# Patient Record
Sex: Female | Born: 1940 | Race: White | Hispanic: No | Smoking: Former smoker
Health system: Southern US, Community
[De-identification: ages and names within clinical notes are randomized; demographics above are authoritative.]

## PROBLEM LIST (undated history)

## (undated) DIAGNOSIS — H3530 Unspecified macular degeneration: Secondary | ICD-10-CM

## (undated) DIAGNOSIS — I10 Essential (primary) hypertension: Secondary | ICD-10-CM

## (undated) DIAGNOSIS — J449 Chronic obstructive pulmonary disease, unspecified: Secondary | ICD-10-CM

## (undated) HISTORY — PX: JOINT REPLACEMENT: SHX530

---

## 2018-03-21 ENCOUNTER — Emergency Department: Payer: Medicare Other

## 2018-03-21 ENCOUNTER — Other Ambulatory Visit: Payer: Self-pay

## 2018-03-21 ENCOUNTER — Encounter: Payer: Self-pay | Admitting: Emergency Medicine

## 2018-03-21 ENCOUNTER — Inpatient Hospital Stay
Admission: EM | Admit: 2018-03-21 | Discharge: 2018-03-22 | DRG: 191 | Payer: Medicare Other | Attending: Family Medicine | Admitting: Family Medicine

## 2018-03-21 DIAGNOSIS — Z66 Do not resuscitate: Secondary | ICD-10-CM | POA: Diagnosis present

## 2018-03-21 DIAGNOSIS — M549 Dorsalgia, unspecified: Secondary | ICD-10-CM | POA: Diagnosis not present

## 2018-03-21 DIAGNOSIS — Z8679 Personal history of other diseases of the circulatory system: Secondary | ICD-10-CM | POA: Diagnosis not present

## 2018-03-21 DIAGNOSIS — E440 Moderate protein-calorie malnutrition: Secondary | ICD-10-CM | POA: Diagnosis present

## 2018-03-21 DIAGNOSIS — I10 Essential (primary) hypertension: Secondary | ICD-10-CM | POA: Diagnosis not present

## 2018-03-21 DIAGNOSIS — Z87891 Personal history of nicotine dependence: Secondary | ICD-10-CM

## 2018-03-21 DIAGNOSIS — J209 Acute bronchitis, unspecified: Secondary | ICD-10-CM

## 2018-03-21 DIAGNOSIS — Z87820 Personal history of traumatic brain injury: Secondary | ICD-10-CM | POA: Diagnosis not present

## 2018-03-21 DIAGNOSIS — R0902 Hypoxemia: Secondary | ICD-10-CM | POA: Diagnosis present

## 2018-03-21 DIAGNOSIS — E279 Disorder of adrenal gland, unspecified: Secondary | ICD-10-CM | POA: Diagnosis not present

## 2018-03-21 DIAGNOSIS — H119 Unspecified disorder of conjunctiva: Secondary | ICD-10-CM | POA: Diagnosis present

## 2018-03-21 DIAGNOSIS — H3530 Unspecified macular degeneration: Secondary | ICD-10-CM | POA: Diagnosis not present

## 2018-03-21 DIAGNOSIS — Z79899 Other long term (current) drug therapy: Secondary | ICD-10-CM

## 2018-03-21 DIAGNOSIS — G8929 Other chronic pain: Secondary | ICD-10-CM | POA: Diagnosis present

## 2018-03-21 DIAGNOSIS — K449 Diaphragmatic hernia without obstruction or gangrene: Secondary | ICD-10-CM | POA: Diagnosis present

## 2018-03-21 DIAGNOSIS — Z7951 Long term (current) use of inhaled steroids: Secondary | ICD-10-CM

## 2018-03-21 DIAGNOSIS — J440 Chronic obstructive pulmonary disease with acute lower respiratory infection: Secondary | ICD-10-CM | POA: Diagnosis present

## 2018-03-21 DIAGNOSIS — K219 Gastro-esophageal reflux disease without esophagitis: Secondary | ICD-10-CM | POA: Diagnosis present

## 2018-03-21 DIAGNOSIS — R791 Abnormal coagulation profile: Secondary | ICD-10-CM | POA: Diagnosis present

## 2018-03-21 DIAGNOSIS — J449 Chronic obstructive pulmonary disease, unspecified: Secondary | ICD-10-CM | POA: Diagnosis present

## 2018-03-21 DIAGNOSIS — J441 Chronic obstructive pulmonary disease with (acute) exacerbation: Principal | ICD-10-CM

## 2018-03-21 DIAGNOSIS — Z6823 Body mass index (BMI) 23.0-23.9, adult: Secondary | ICD-10-CM

## 2018-03-21 HISTORY — DX: Essential (primary) hypertension: I10

## 2018-03-21 LAB — BLOOD GAS, ARTERIAL
ACID-BASE EXCESS: 7.4 mmol/L — AB (ref 0.0–2.0)
BICARBONATE: 32 mmol/L — AB (ref 20.0–28.0)
FIO2: 21
O2 SAT: 90.2 %
PATIENT TEMPERATURE: 37
PH ART: 7.47 — AB (ref 7.350–7.450)
pCO2 arterial: 44 mmHg (ref 32.0–48.0)
pO2, Arterial: 55 mmHg — ABNORMAL LOW (ref 83.0–108.0)

## 2018-03-21 LAB — INFLUENZA PANEL BY PCR (TYPE A & B)
INFLBPCR: NEGATIVE
Influenza A By PCR: NEGATIVE

## 2018-03-21 LAB — MRSA PCR SCREENING: MRSA BY PCR: NEGATIVE

## 2018-03-21 LAB — BASIC METABOLIC PANEL
ANION GAP: 11 (ref 5–15)
BUN: 12 mg/dL (ref 6–20)
CALCIUM: 9.9 mg/dL (ref 8.9–10.3)
CHLORIDE: 102 mmol/L (ref 101–111)
CO2: 30 mmol/L (ref 22–32)
CREATININE: 0.65 mg/dL (ref 0.44–1.00)
GFR calc non Af Amer: >60 mL/min (ref >60)
GLUCOSE: 103 mg/dL — AB (ref 65–99)
Potassium: 3.9 mmol/L (ref 3.5–5.1)
Sodium: 143 mmol/L (ref 135–145)

## 2018-03-21 LAB — CBC
HEMATOCRIT: 39.3 % (ref 35.0–47.0)
HEMOGLOBIN: 12.8 g/dL (ref 12.0–16.0)
MCH: 28.4 pg (ref 26.0–34.0)
MCHC: 32.6 g/dL (ref 32.0–36.0)
MCV: 87.2 fL (ref 80.0–100.0)
Platelets: 289 10*3/uL (ref 150–440)
RBC: 4.51 MIL/uL (ref 3.80–5.20)
RDW: 14.9 % — AB (ref 11.5–14.5)
WBC: 13.2 10*3/uL — ABNORMAL HIGH (ref 3.6–11.0)

## 2018-03-21 LAB — FIBRIN DERIVATIVES D-DIMER (ARMC ONLY): FIBRIN DERIVATIVES D-DIMER (ARMC): 1269.51 ng{FEU}/mL — AB (ref 0.00–499.00)

## 2018-03-21 LAB — BRAIN NATRIURETIC PEPTIDE: B NATRIURETIC PEPTIDE 5: 71 pg/mL (ref 0.0–100.0)

## 2018-03-21 LAB — TROPONIN I: Troponin I: <0.03 ng/mL (ref <0.03)

## 2018-03-21 MED ORDER — IOPAMIDOL (ISOVUE-370) INJECTION 76%
75.0000 mL | Freq: Once | INTRAVENOUS | Status: AC | PRN
  Administered 2018-03-21: 75 mL via INTRAVENOUS

## 2018-03-21 MED ORDER — OXYCODONE HCL 5 MG PO TABS: 5.0000 mg | ORAL_TABLET | Freq: Four times a day (QID) | ORAL | Status: DC | PRN

## 2018-03-21 MED ORDER — ACETAMINOPHEN 325 MG PO TABS
ORAL_TABLET | ORAL | Status: AC
  Filled 2018-03-21: qty 2

## 2018-03-21 MED ORDER — MOMETASONE FURO-FORMOTEROL FUM 200-5 MCG/ACT IN AERO
2.0000 | INHALATION_SPRAY | Freq: Two times a day (BID) | RESPIRATORY_TRACT | Status: DC
  Administered 2018-03-21 – 2018-03-22 (×2): 2 via RESPIRATORY_TRACT
  Filled 2018-03-21: qty 8.8

## 2018-03-21 MED ORDER — ACETAMINOPHEN 325 MG PO TABS: 650.0000 mg | ORAL_TABLET | Freq: Four times a day (QID) | ORAL | Status: DC | PRN

## 2018-03-21 MED ORDER — METHYLPREDNISOLONE SODIUM SUCC 125 MG IJ SOLR
125.0000 mg | INTRAMUSCULAR | Status: AC
  Administered 2018-03-21: 125 mg via INTRAVENOUS
  Filled 2018-03-21: qty 2

## 2018-03-21 MED ORDER — SENNOSIDES-DOCUSATE SODIUM 8.6-50 MG PO TABS
2.0000 | ORAL_TABLET | Freq: Two times a day (BID) | ORAL | Status: DC
Start: 2018-03-21 — End: 2018-03-22
  Administered 2018-03-21 – 2018-03-22 (×2): 2 via ORAL
  Filled 2018-03-21 (×2): qty 2

## 2018-03-21 MED ORDER — POLYETHYLENE GLYCOL 3350 17 G PO PACK
17.0000 g | PACK | Freq: Every day | ORAL | Status: DC | PRN
Start: 2018-03-21 — End: 2018-03-22

## 2018-03-21 MED ORDER — ALBUTEROL SULFATE (2.5 MG/3ML) 0.083% IN NEBU: 2.5000 mg | INHALATION_SOLUTION | RESPIRATORY_TRACT | Status: DC | PRN

## 2018-03-21 MED ORDER — SERTRALINE HCL 50 MG PO TABS
50.0000 mg | ORAL_TABLET | Freq: Every day | ORAL | Status: DC
  Administered 2018-03-22: 50 mg via ORAL
  Filled 2018-03-21: qty 1

## 2018-03-21 MED ORDER — FAMOTIDINE IN NACL 20-0.9 MG/50ML-% IV SOLN
20.0000 mg | Freq: Two times a day (BID) | INTRAVENOUS | Status: DC
  Administered 2018-03-21 – 2018-03-22 (×2): 20 mg via INTRAVENOUS
  Filled 2018-03-21 (×2): qty 50

## 2018-03-21 MED ORDER — ACETAMINOPHEN 325 MG PO TABS: 975.0000 mg | ORAL_TABLET | Freq: Four times a day (QID) | ORAL | Status: DC | PRN

## 2018-03-21 MED ORDER — GUAIFENESIN ER 600 MG PO TB12
600.0000 mg | ORAL_TABLET | Freq: Two times a day (BID) | ORAL | Status: DC
  Administered 2018-03-21 – 2018-03-22 (×3): 600 mg via ORAL
  Filled 2018-03-21 (×3): qty 1

## 2018-03-21 MED ORDER — MELATONIN 5 MG PO TABS
5.0000 mg | ORAL_TABLET | Freq: Every day | ORAL | Status: DC
  Administered 2018-03-21: 5 mg via ORAL
  Filled 2018-03-21 (×2): qty 1

## 2018-03-21 MED ORDER — AZITHROMYCIN 500 MG PO TABS: 500.0000 mg | ORAL_TABLET | Freq: Every day | ORAL | Status: DC

## 2018-03-21 MED ORDER — PREGABALIN 75 MG PO CAPS
150.0000 mg | ORAL_CAPSULE | Freq: Two times a day (BID) | ORAL | Status: DC
  Administered 2018-03-21 – 2018-03-22 (×2): 150 mg via ORAL
  Filled 2018-03-21 (×2): qty 2

## 2018-03-21 MED ORDER — METOPROLOL TARTRATE 25 MG PO TABS
25.0000 mg | ORAL_TABLET | Freq: Two times a day (BID) | ORAL | Status: DC
  Administered 2018-03-21 – 2018-03-22 (×2): 25 mg via ORAL
  Filled 2018-03-21 (×3): qty 1

## 2018-03-21 MED ORDER — POLYVINYL ALCOHOL 1.4 % OP SOLN
1.0000 [drp] | Freq: Every day | OPHTHALMIC | Status: DC | PRN
  Filled 2018-03-21: qty 15

## 2018-03-21 MED ORDER — AZITHROMYCIN 500 MG PO TABS: 250.0000 mg | ORAL_TABLET | Freq: Every day | ORAL | Status: DC

## 2018-03-21 MED ORDER — IPRATROPIUM-ALBUTEROL 0.5-2.5 (3) MG/3ML IN SOLN
3.0000 mL | Freq: Once | RESPIRATORY_TRACT | Status: AC
  Administered 2018-03-21: 3 mL via RESPIRATORY_TRACT
  Filled 2018-03-21: qty 3

## 2018-03-21 MED ORDER — METHYLPREDNISOLONE SODIUM SUCC 125 MG IJ SOLR
60.0000 mg | Freq: Four times a day (QID) | INTRAMUSCULAR | Status: DC
  Administered 2018-03-21 – 2018-03-22 (×3): 60 mg via INTRAVENOUS
  Filled 2018-03-21 (×3): qty 2

## 2018-03-21 MED ORDER — ACETAMINOPHEN 325 MG PO TABS
650.0000 mg | ORAL_TABLET | Freq: Once | ORAL | Status: AC
  Administered 2018-03-21: 650 mg via ORAL

## 2018-03-21 MED ORDER — ALBUTEROL SULFATE (2.5 MG/3ML) 0.083% IN NEBU
2.5000 mg | INHALATION_SOLUTION | Freq: Once | RESPIRATORY_TRACT | Status: AC
  Administered 2018-03-21: 2.5 mg via RESPIRATORY_TRACT
  Filled 2018-03-21: qty 3

## 2018-03-21 MED ORDER — LORATADINE 10 MG PO TABS
5.0000 mg | ORAL_TABLET | Freq: Every day | ORAL | Status: DC
  Administered 2018-03-21 – 2018-03-22 (×2): 5 mg via ORAL
  Filled 2018-03-21 (×2): qty 1

## 2018-03-21 MED ORDER — CALCIUM CARBONATE ANTACID 500 MG PO CHEW: 1.0000 | CHEWABLE_TABLET | Freq: Three times a day (TID) | ORAL | Status: DC | PRN

## 2018-03-21 MED ORDER — ONDANSETRON HCL 4 MG/2ML IJ SOLN: 4.0000 mg | Freq: Four times a day (QID) | INTRAMUSCULAR | Status: DC | PRN

## 2018-03-21 MED ORDER — ADULT MULTIVITAMIN W/MINERALS CH
1.0000 | ORAL_TABLET | ORAL | Status: DC
  Administered 2018-03-22: 1 via ORAL
  Filled 2018-03-21: qty 1

## 2018-03-21 MED ORDER — FERROUS SULFATE 325 (65 FE) MG PO TABS
325.0000 mg | ORAL_TABLET | ORAL | Status: DC
  Administered 2018-03-22: 325 mg via ORAL
  Filled 2018-03-21: qty 1

## 2018-03-21 MED ORDER — AZITHROMYCIN 250 MG PO TABS
250.0000 mg | ORAL_TABLET | Freq: Every day | ORAL | Status: DC
  Administered 2018-03-22: 250 mg via ORAL
  Filled 2018-03-21: qty 1

## 2018-03-21 MED ORDER — HYDRALAZINE HCL 20 MG/ML IJ SOLN: 10.0000 mg | INTRAMUSCULAR | Status: DC | PRN

## 2018-03-21 MED ORDER — ACETAMINOPHEN 650 MG RE SUPP: 650.0000 mg | Freq: Four times a day (QID) | RECTAL | Status: DC | PRN

## 2018-03-21 MED ORDER — SODIUM CHLORIDE 0.9% FLUSH
3.0000 mL | Freq: Two times a day (BID) | INTRAVENOUS | Status: DC
  Administered 2018-03-21 – 2018-03-22 (×2): 3 mL via INTRAVENOUS

## 2018-03-21 MED ORDER — ONDANSETRON HCL 4 MG PO TABS: 4.0000 mg | ORAL_TABLET | Freq: Four times a day (QID) | ORAL | Status: DC | PRN

## 2018-03-21 MED ORDER — LACTATED RINGERS IV SOLN
INTRAVENOUS | Status: DC
  Administered 2018-03-21: 18:00:00 via INTRAVENOUS

## 2018-03-21 MED ORDER — LIDOCAINE 5 % EX PTCH
1.0000 | MEDICATED_PATCH | CUTANEOUS | Status: DC
  Administered 2018-03-22: 1 via TRANSDERMAL
  Filled 2018-03-21: qty 1

## 2018-03-21 MED ORDER — AZITHROMYCIN 500 MG PO TABS
500.0000 mg | ORAL_TABLET | Freq: Once | ORAL | Status: AC
  Administered 2018-03-21: 500 mg via ORAL
  Filled 2018-03-21: qty 1

## 2018-03-21 MED ORDER — ENOXAPARIN SODIUM 40 MG/0.4ML ~~LOC~~ SOLN
40.0000 mg | SUBCUTANEOUS | Status: DC
  Administered 2018-03-21: 40 mg via SUBCUTANEOUS
  Filled 2018-03-21: qty 0.4

## 2018-03-21 MED ORDER — QUETIAPINE FUMARATE 25 MG PO TABS
25.0000 mg | ORAL_TABLET | Freq: Every day | ORAL | Status: DC
  Administered 2018-03-21: 25 mg via ORAL
  Filled 2018-03-21: qty 1

## 2018-03-21 NOTE — ED Provider Notes (Signed)
[facility] Emergency Department Provider Note   ____________________________________________   First MD Initiated Contact with Patient 03/21/18 1205     (approximate)  I have reviewed the triage vital signs and the nursing notes.   HISTORY  Chief Complaint Shortness of Breath    HPI Heather [name] is a 77 y.o. female reports a previous history of COPD, previous left hip fracture which she reports was treated surgically at [facility] a long time ago.  Patient presents today reports she is living in assisted living, yesterday she began X feeling slightly short of breath, this morning she was wheezing.  She was given 2 breathing treatments without relief prompting her to call EMS.  She reports her breathing seems slightly improved at present, but still feels slightly short of breath.  Does not use oxygen at home.  Occasional cough, no productive sputum.  No fever.  No abdominal pain.  Denies chest pain.  No leg swelling, denies history of blood clots.  Currently reports mild shortness of breath.  Past Medical History:  Diagnosis Date  . Hypertension     There are no active problems to display for this patient.   History reviewed. No pertinent surgical history.  Prior to Admission medications   Medication Sig Start Date End Date Taking? Authorizing Provider  acetaminophen (TYLENOL) 325 MG tablet Take 975 mg by mouth every 6 (six) hours as needed.   Yes [provider]  albuterol (PROVENTIL HFA;VENTOLIN HFA) 108 (90 Base) MCG/ACT inhaler Inhale 1 puff into the lungs every 6 (six) hours as needed for wheezing or shortness of breath.   Yes [provider]  calcium carbonate (TUMS - DOSED IN MG ELEMENTAL CALCIUM) 500 MG chewable tablet Chew 1 tablet by mouth every 8 (eight) hours as needed for indigestion or heartburn.   Yes [provider]  ferrous sulfate 325 (65 FE) MG EC tablet Take 325 mg by mouth every morning.   Yes  [provider]  Fluticasone-Salmeterol (ADVAIR) 250-50 MCG/DOSE AEPB Inhale 1 puff into the lungs every 12 (twelve) hours.   Yes [provider]  lidocaine (LIDODERM) 5 % Place 1 patch onto the skin daily. Remove & Discard patch within 12 hours or as directed by MD   Yes [provider]  loratadine (CLARITIN) 5 MG chewable tablet Chew 5 mg by mouth daily.   Yes [provider]  Melatonin 3 MG TABS Take 3 mg by mouth at bedtime.   Yes [provider]  Multiple Vitamin (MULTIVITAMIN WITH MINERALS) TABS tablet Take 1 tablet by mouth every morning.   Yes [provider]  omeprazole (PRILOSEC) 40 MG capsule Take 40 mg by mouth every morning.   Yes [provider]  oxyCODONE (OXY IR/ROXICODONE) 5 MG immediate release tablet Take 5 mg by mouth every 6 (six) hours as needed for severe pain.   Yes [provider]  Polyethyl Glycol-Propyl Glycol (SYSTANE) 0.4-0.3 % SOLN Place 1 drop into both eyes daily as needed.   Yes [provider]  polyethylene glycol (MIRALAX / GLYCOLAX) packet Take 17 g by mouth daily as needed.   Yes [provider]  pregabalin (LYRICA) 150 MG capsule Take 150 mg by mouth every 12 (twelve) hours.   Yes [provider]  QUEtiapine (SEROQUEL) 25 MG tablet Take 25 mg by mouth at bedtime.   Yes [provider]  ranitidine (ZANTAC) 75 MG tablet Take 75 mg by mouth every 12 (twelve) hours as needed  for heartburn.   Yes [provider]  sennosides-docusate sodium (SENOKOT-S) 8.6-50 MG tablet Take 2 tablets by mouth 2 (two) times daily.   Yes [provider]  sertraline (ZOLOFT) 50 MG tablet Take 50 mg by mouth daily.   Yes [provider]    Allergies Patient has no known allergies.  No family history on file.  Social History Social History   Tobacco Use  . Smoking status: Former Games developermoker  . Smokeless tobacco: Never Used  Substance Use Topics  .  Alcohol use: Not Currently  . Drug use: Not Currently    Review of Systems Constitutional: No fever/chills Eyes: No visual changes. ENT: No sore throat. Cardiovascular: Denies chest pain. Respiratory: See HPI Gastrointestinal: No abdominal pain.  No nausea, no vomiting.  No diarrhea.  No constipation. Genitourinary: Negative for dysuria. Musculoskeletal: Negative for back pain. Skin: Negative for rash. Neurological: Negative for headaches, focal weakness or numbness.    ____________________________________________   PHYSICAL EXAM:  VITAL SIGNS: ED Triage Vitals  Enc Vitals Group     BP 03/21/18 1145 (!) 160/79     Pulse Rate 03/21/18 1141 92     Resp 03/21/18 1141 20     Temp 03/21/18 1141 97.6 F (36.4 C)     Temp Source 03/21/18 1141 Oral     SpO2 03/21/18 1137 96 %     Weight 03/21/18 1143 144 lb 4.8 oz (65.5 kg)     Height --      Head Circumference --      Peak Flow --      Pain Score 03/21/18 1142 0     Pain Loc --      Pain Edu? --      Excl. in GC? --     Constitutional: Alert and oriented. Well appearing and in no acute distress appears just slightly dyspneic sitting upright. Eyes: Conjunctivae are normal. Head: Atraumatic. Nose: No congestion/rhinnorhea. Mouth/Throat: Mucous membranes are moist. Neck: No stridor.   Cardiovascular: Normal rate, regular rhythm. Grossly normal heart sounds.  Good peripheral circulation. Respiratory: Some mild tachypnea, speaks in phrases.  Mild increased work of breathing.  Lungs CTAB except for slight end expiratory wheezing in the lower lobes bilaterally.  End-tidal CO2 measuring about 30 with a slight pattern of "shark fin" appearance on nasal cannula. Gastrointestinal: Soft and nontender. No distention. Musculoskeletal: No lower extremity tenderness nor edema. Neurologic:  Normal speech and language. No gross focal neurologic deficits are appreciated.  Skin:  Skin is warm, dry and intact. No rash noted. Psychiatric:  Mood and affect are normal. Speech and behavior are normal.  ____________________________________________   LABS (all labs ordered are listed, but only abnormal results are displayed)  Labs Reviewed  CBC - Abnormal; Notable for the following components:      Result Value   WBC 13.2 (*)    RDW 14.9 (*)    All other components within normal limits  BASIC METABOLIC PANEL - Abnormal; Notable for the following components:   Glucose, Bld 103 (*)    All other components within normal limits  FIBRIN DERIVATIVES D-DIMER ([facility] ONLY) - Abnormal; Notable for the following components:   Fibrin derivatives D-dimer ([facility]) 1,269.51 (*)    All other components within normal limits  TROPONIN I  BRAIN NATRIURETIC PEPTIDE  INFLUENZA PANEL BY PCR (TYPE A & B)   ____________________________________________  EKG  Reviewed and her by me 1145 Heart rate 90 QRS 100 QTC 450 Normal sinus rhythm, no  evidence of acute ischemia denoted.  Some slight baseline artifact is present. ____________________________________________  RADIOLOGY  CT imaging reviewed, negative for acute.    Incidental adrenal mass is noted. ____________________________________________   PROCEDURES  Procedure(s) performed: None  Procedures  Critical Care performed: No  ____________________________________________   INITIAL IMPRESSION / ASSESSMENT AND PLAN / ED COURSE  Pertinent labs & imaging results that were available during my care of the patient were reviewed by me and considered in my medical decision making (see chart for details).  Patient returns for evaluation of dyspnea.  Began last night, associated with some wheezing with some improvement with nebulizer treatments given prior to EMS arrival.  She does have some mild ongoing wheezing and does appear slightly dyspneic speaking in phrases.  Denies any chest pain, no evidence of acute cardiac disease.  No associated edema or JVD noted.  No fever, does have a  slightly elevated white count.  History of COPD and bronchitis, today seems to feel similar and clinical exam seem to suggest acute bronchitis.    ----------------------------------------- 3:10 PM on 03/21/2018 -----------------------------------------  Reviewed telemetry record, patient is having oxygen saturations just less than 88% at times.  Family at bedside reports she does still appear slightly dyspneic.  Currently satting in the mid 90s on 2 L nasal cannula speaking short phrases.  Does not appear in any acute in extremis.  Discussed with family at the bedside including patient's son and daughter-in-law, will admit her for COPD exacerbation.  Further workup and care under the hospitalist service.  I have ordered additional nebulizer therapy and azithromycin at this time.  I have also ordered an influenza test given the seasonality, though overall I do not suspect high likelihood of the flu given today's presentation.  ____________________________________________   FINAL CLINICAL IMPRESSION(S) / ED DIAGNOSES  Final diagnoses:  COPD exacerbation (HCC)  Acute bronchitis, unspecified organism      NEW MEDICATIONS STARTED DURING THIS VISIT:  New Prescriptions   No medications on file     Note:  This document was prepared using Dragon voice recognition software and may include unintentional dictation errors.     Sharyn Creamer, MD 03/21/18 1510

## 2018-03-21 NOTE — ED Triage Notes (Signed)
[facility]. EMS reports that pt has hx/o COPD, staff gave pt inhaler this morning but it did not help with her breathing. Pt is in NAD at this time.

## 2018-03-21 NOTE — Progress Notes (Signed)
LCSW met with patient and obtained verbal consent to speak to family and to complete her assessment. Patient was polite and calm.   LCSW offered support to family members and completed assessment and Fl2 started with HCPOA and Guardian her son Lequita Asal.  Patient will return to [facility] once discharged. No further needs  [employer] LCSW [phone]

## 2018-03-21 NOTE — H&P (Addendum)
[employer] [facility] [place] [facility]   PATIENT NAME: Heather [name]    MR#:  [id number]  DATE OF BIRTH:  [birth year]  DATE OF ADMISSION:  03/21/2018  PRIMARY CARE PHYSICIAN: System, Pcp Not In   REQUESTING/REFERRING PHYSICIAN:   CHIEF COMPLAINT:   Chief Complaint  Patient presents with  . Shortness of Breath    HISTORY OF PRESENT ILLNESS: Heather [name]  is a 77 y.o. 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  PAST MEDICAL HISTORY:   Past Medical History:  Diagnosis Date  . Hypertension   See above  PAST SURGICAL HISTORY:  Hysterectomy Hip fracture repair  SOCIAL HISTORY:  Social History   Tobacco Use  . Smoking status: Former Games developer  . Smokeless tobacco: Never Used  Substance Use Topics  . Alcohol use: Not Currently    FAMILY HISTORY:  Hypertension Diabetes Macular degeneration  DRUG ALLERGIES: NKDA  REVIEW OF SYSTEMS:   CONSTITUTIONAL: No fever, +fatigue, weakness.  EYES: No blurred or double vision.  EARS, NOSE, AND THROAT: No tinnitus or ear pain.  RESPIRATORY: + cough, shortness of breath, wheezing, no hemoptysis.  CARDIOVASCULAR: No chest pain, orthopnea, edema.  GASTROINTESTINAL: No nausea, vomiting, diarrhea or abdominal pain.  GENITOURINARY: No dysuria, hematuria.  ENDOCRINE: No  polyuria, nocturia,  HEMATOLOGY: No anemia, easy bruising or bleeding SKIN: No rash or lesion. MUSCULOSKELETAL: No joint pain or arthritis.   NEUROLOGIC: No tingling, numbness, weakness.  PSYCHIATRY: No anxiety or depression.   MEDICATIONS AT HOME:  Prior to Admission medications   Medication Sig Start Date End Date Taking? Authorizing Provider  acetaminophen (TYLENOL) 325 MG tablet Take 975 mg by mouth every 6 (six) hours as needed.   Yes [provider]  albuterol (PROVENTIL HFA;VENTOLIN HFA) 108 (90 Base) MCG/ACT inhaler Inhale 1 puff into the lungs every 6 (six) hours as needed for wheezing or shortness of breath.   Yes [provider]  calcium carbonate (TUMS - DOSED IN MG ELEMENTAL CALCIUM) 500 MG chewable tablet Chew 1 tablet by mouth every 8 (eight) hours as needed for indigestion or heartburn.   Yes [provider]  ferrous sulfate 325 (65 FE) MG EC tablet Take 325 mg by mouth every morning.   Yes [provider]  Fluticasone-Salmeterol (ADVAIR) 250-50 MCG/DOSE AEPB Inhale 1 puff into the lungs every 12 (twelve) hours.   Yes [provider]  lidocaine (LIDODERM) 5 % Place 1 patch onto the skin daily. Remove & Discard patch within 12 hours or as directed by MD   Yes [provider]  loratadine (CLARITIN) 5 MG chewable tablet Chew 5 mg by mouth daily.   Yes [provider]  Melatonin 3 MG TABS Take 3 mg by mouth at bedtime.   Yes [provider]  Multiple Vitamin (MULTIVITAMIN WITH MINERALS) TABS tablet Take 1 tablet by mouth every morning.   Yes [provider]  omeprazole (PRILOSEC) 40 MG capsule Take 40 mg by mouth every morning.   Yes [provider]  oxyCODONE (OXY IR/ROXICODONE) 5 MG immediate release tablet Take 5 mg by mouth every 6 (six) hours as needed for severe pain.   Yes [provider]  Polyethyl Glycol-Propyl Glycol (SYSTANE) 0.4-0.3 % SOLN Place 1 drop into both eyes daily  as needed.   Yes [provider]  polyethylene glycol (MIRALAX / GLYCOLAX) packet Take 17 g by mouth daily as needed.   Yes [provider]  pregabalin (LYRICA) 150 MG capsule Take 150 mg by mouth every 12 (twelve) hours.   Yes [provider]  QUEtiapine (SEROQUEL) 25 MG tablet Take 25 mg by mouth at bedtime.   Yes [provider]  ranitidine (ZANTAC) 75 MG tablet Take 75 mg by mouth every 12 (twelve) hours as needed for heartburn.   Yes [provider]  sennosides-docusate sodium (SENOKOT-S) 8.6-50 MG tablet Take 2 tablets by mouth 2 (two) times daily.   Yes [provider]  sertraline (ZOLOFT) 50 MG tablet Take 50 mg by mouth daily.   Yes [provider]      PHYSICAL EXAMINATION:   VITAL SIGNS: Blood pressure (!) 170/82, pulse 89, temperature 97.6 F (36.4 C), temperature source Oral, resp. rate (!) 22, weight 65.5 kg (144 lb 4.8 oz), SpO2 97 %.  GENERAL:  77 y.o.-year-old patient lying in the bed with no acute distress.  Malnourished/frail appearance EYES: Pupils equal, round, reactive to light and accommodation. No scleral icterus. Extraocular muscles intact.  HEENT: Head atraumatic, normocephalic. Oropharynx and nasopharynx clear.  NECK:  Supple, no jugular venous distention. No thyroid enlargement, no tenderness.  LUNGS: Bilateral diminished breath sounds with crepitus. No use of accessory muscles of respiration.  CARDIOVASCULAR: S1, S2 normal. No murmurs, rubs, or gallops.  ABDOMEN: Soft, nontender, nondistended. Bowel sounds present. No organomegaly or mass.  EXTREMITIES: No pedal edema, cyanosis, or clubbing.  NEUROLOGIC: Cranial nerves II through XII are intact. MAES. Gait not checked.  PSYCHIATRIC: The patient is alert and oriented x 3.  SKIN: No obvious rash, lesion, or ulcer.   LABORATORY PANEL:   CBC Recent Labs  Lab 03/21/18 1146  WBC 13.2*  HGB 12.8  HCT 39.3  PLT 289  MCV 87.2  MCH 28.4  MCHC 32.6   RDW 14.9*   ------------------------------------------------------------------------------------------------------------------  Chemistries  Recent Labs  Lab 03/21/18 1146  NA 143  K 3.9  CL 102  CO2 30  GLUCOSE 103*  BUN 12  CREATININE 0.65  CALCIUM 9.9   ------------------------------------------------------------------------------------------------------------------ CrCl cannot be calculated (Unknown ideal weight.). ------------------------------------------------------------------------------------------------------------------ No results for input(s): TSH, T4TOTAL, T3FREE, THYROIDAB in the last 72 hours.  Invalid input(s): FREET3   Coagulation profile No results for input(s): INR, PROTIME in the last 168 hours. ------------------------------------------------------------------------------------------------------------------- No results for input(s): DDIMER in the last 72 hours. -------------------------------------------------------------------------------------------------------------------  Cardiac Enzymes Recent Labs  Lab 03/21/18 1146  TROPONINI <0.03   ------------------------------------------------------------------------------------------------------------------ Invalid input(s): POCBNP  ---------------------------------------------------------------------------------------------------------------  Urinalysis No results found for: COLORURINE, APPEARANCEUR, LABSPEC, PHURINE, GLUCOSEU, HGBUR, BILIRUBINUR, KETONESUR, PROTEINUR, UROBILINOGEN, NITRITE, LEUKOCYTESUR   RADIOLOGY: Dg Chest 2 View  Result Date: 03/21/2018 CLINICAL DATA:  Respiratory distress.  COPD. EXAM: CHEST - 2 VIEW COMPARISON:  No prior. FINDINGS: Mediastinum and hilar structures normal. Lungs are clear. No pleural effusion or pneumothorax. Heart size normal. Sliding hiatal hernia. No acute bony abnormality. IMPRESSION: 1.  No acute cardiopulmonary disease. 2.  Sliding hiatal hernia.  Electronically Signed  ByMaisie Fus  Register   On: 03/21/2018 13:08   Ct Angio Chest Pe W And/or Wo Contrast  Result Date: 03/21/2018 CLINICAL DATA:  Respiratory distress.  History of COPD. EXAM: CT ANGIOGRAPHY CHEST WITH CONTRAST TECHNIQUE: Multidetector CT imaging of the chest was performed using the standard protocol during bolus administration of intravenous contrast. Multiplanar CT image reconstructions and MIPs were obtained to evaluate the vascular anatomy. CONTRAST:  75mL ISOVUE-370 IOPAMIDOL (ISOVUE-370) INJECTION 76% COMPARISON:  None. FINDINGS: Cardiovascular: There is no pulmonary embolism identified within the main, lobar or segmental pulmonary arteries bilaterally. Heart size is upper normal. No pericardial effusion. Aortic atherosclerosis. No thoracic aortic aneurysm or evidence of aortic dissection. Mediastinum/Nodes: Hiatal hernia, moderate to large. No enlarged lymph nodes seen within the mediastinum or perihilar regions. Esophagus is unremarkable. Trachea and central bronchi are unremarkable. Lungs/Pleura: Mild atelectasis bilaterally. Bilateral upper lobe emphysema, mild to moderate in degree. No evidence of pneumonia. No pleural effusion or pneumothorax. Upper Abdomen: Left adrenal mass measures approximately 3 cm greatest dimension, with Hounsfield unit measurements of 15-23. Musculoskeletal: Thoracic spine kyphosis. No acute or suspicious osseous finding. Review of the MIP images confirms the above findings. IMPRESSION: 1. No acute findings. No pulmonary embolism seen. No pneumonia, pleural effusion or pneumothorax. 2. **An incidental finding of potential clinical significance has been found. Left adrenal mass measuring approximately 3 cm greatest dimension, with indeterminate Hounsfield unit measurements of 15-23. Per consensus guidelines, recommend nonemergent adrenal protocol CT for further characterization.** 3. Hiatal hernia, moderate to large in size. 4. Bilateral upper lobe  emphysematous change, mild to moderate in degree. 5. Aortic atherosclerosis. Aortic Atherosclerosis (ICD10-I70.0) and Emphysema (ICD10-J43.9). Electronically Signed   By: Bary Richard M.D.   On: 03/21/2018 14:11    EKG: Orders placed or performed during the hospital encounter of 03/21/18  . EKG 12-Lead  . EKG 12-Lead  . EKG 12-Lead  . EKG 12-Lead    IMPRESSION AND PLAN: 1 acute on COPD exacerbation Admit to regular nursing for bed, empiric azithromycin for 5-day course, IV Solu-Medrol with tapering as tolerated, supplemental oxygen weaning as tolerated, aggressive pulmonary toilet with bronchodilator therapy, inhaled corticosteroids twice daily, mucolytic agents, respiratory therapy to see, and continue close medical monitoring  2 chronic benign essential hypertension  currently uncontrolled Continue home regiment, add beta-blocker therapy, IV hydralazine as needed systolic blood pressure greater than 160, vitals per routine, make changes as per necessary  3 chronic GERD with large hiatal hernia Stable Pepcid IV twice daily  4 acute on chronic moderate protein energy malnutrition Dietary consulted, check prealbumin level  5 acute elevated d-dimer CT chest noted, negative for PE Check lower extremity Dopplers to evaluate for possible deep venous thrombosis  6 acute left adrenal mass Incidentally found on CT of the chest Will have patient follow-up with surgery status post discharge for reevaluation   All the records are reviewed and case discussed with ED provider. Management plans discussed with the patient, family and they are in agreement.  CODE STATUS:dnr Advance Directive Documentation     Most Recent Value  Type of Advance Directive  Healthcare Power of Attorney  Pre-existing out of facility DNR order (yellow form or pink MOST form)  -  "MOST" Form in Place?  -       TOTAL TIME TAKING CARE OF THIS PATIENT: 45 minutes.    Evelena Asa Anikah Hogge M.D on 03/21/2018    Between 7am to 6pm - Pager - [phone]  After 6pm go to [email or web address] - password  [email or web address]  [facility] [facility]  Office  [phone]  CC: Primary care physician; System, Pcp Not In   Note: This dictation was prepared with Dragon dictation along with smaller phrase technology. Any transcriptional errors that result from this process are unintentional.

## 2018-03-21 NOTE — Clinical Social Work Note (Signed)
Clinical Social Work Assessment  Patient Details  Name: Heather CavesMargaret [name] MRN: [id number] Date of Birth: 1941-12-09  Date of referral:  03/21/18               Reason for consult:  Other (Comment Required)(From [facility])                Permission sought to share information with:    Permission granted to share information::  Yes, Verbal Permission Granted  Name::     Evelina BucyCharley Schuitema [facility]- Guardian and son [phone] and Deborah ChalkCharlene Carriker [phone]  Agency::  [facility]  Relationship::  yes  Contact Information:  yes  Housing/Transportation Living arrangements for the past 2 months:  Assisted Living Facility Source of Information:  Patient, Adult Children, Guardian Patient Interpreter Needed:  None Criminal Activity/Legal Involvement Pertinent to Current Situation/Hospitalization:    Significant Relationships:  Adult Children, Other Family Members Lives with:  Facility Resident Do you feel safe going back to the place where you live?  Yes Need for family participation in patient care:  Yes (Comment)  Care giving concerns: Patient has excellent family support no concerns at this time   Office managerocial Worker assessment / plan: LCSW introduced myself to family and patient and collected information to complete assessment and start Fl2. 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 She requires full support with all her ADL, she is traumatic brain injured. 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 She has medicare AB insurance. Reviewed caregiver support  strategies and provided 1-1 supportive discussion for family members. No further needs   Employment status:  Retired Health and safety inspectornsurance information:  Medicare(Medicare part A and B) PT Recommendations:  Not assessed at this time Information / Referral to community resources:     Patient/Family's Response to care:  Good understanding  Patient/Family's Understanding of and Emotional Response to Diagnosis, Current Treatment, and Prognosis:  Good  Emotional Assessment Appearance:  Appears stated age Attitude/Demeanor/Rapport:  Gracious Affect (typically observed):  Calm, Accepting Orientation:  Oriented to Self, Oriented to Place Alcohol / Substance use:  Not Applicable Psych involvement (Current and /or in the community):  No (Comment)  Discharge Needs  Concerns to be addressed:  No discharge needs identified Readmission within the last 30 days:  No Current discharge risk:  None Barriers to Discharge:  No Barriers Identified   Cheron SchaumannBandi, Rosiland Sen M, LCSW 03/21/2018, 4:39 PM

## 2018-03-21 NOTE — NC FL2 (Signed)
[place] LEVEL OF CARE SCREENING TOOL     IDENTIFICATION  Patient Name: Heather [name] Birthdate: [birth year] Sex: female Admission Date (Current Location): 03/21/2018  [place] and [place] Number:  Engineering geologist and Address:  [facility], [address] [place],  [address]      Provider Number: [id number]  Attending Physician Name and Address:  Gorden Harms, MD  Relative Name and Phone Number:       Current Level of Care: Hospital Recommended Level of Care: [place] Prior Approval Number:    Date Approved/Denied:   PASRR Number:    Discharge Plan: ALF    Current Diagnoses: Patient Active Problem List   Diagnosis Date Noted  . COPD (chronic obstructive pulmonary disease) ([place]) 03/21/2018    Orientation RESPIRATION BLADDER Height & Weight     Self, Time  O2(2 liters) Incontinent Weight: 143 lb 4.8 oz (65 kg) Height:  _0  (165.1 cm)  BEHAVIORAL SYMPTOMS/MOOD NEUROLOGICAL BOWEL NUTRITION STATUS      Incontinent Diet(normal)  AMBULATORY STATUS COMMUNICATION OF NEEDS Skin   Supervision Verbally                         Personal Care Assistance Level of Assistance  Bathing, Feeding, Dressing, Total care Bathing Assistance: Limited assistance Feeding assistance: Independent Dressing Assistance: Limited assistance Total Care Assistance: Limited assistance   Functional Limitations Info  Hearing, Speech, Sight Sight Info: Impaired(Mac Degeneration) Hearing Info: Adequate Speech Info: Adequate    SPECIAL CARE FACTORS FREQUENCY                       Contractures Contractures Info: Not present    Additional Factors Info  Code Status, Allergies Code Status Info: Full Allergies Info: none listed           Current Medications (03/21/2018):  This is the current hospital active medication list Current Facility-Administered Medications  Medication Dose Route Frequency Provider  Last Rate Last Dose  . acetaminophen (TYLENOL) tablet 650 mg  650 mg Oral Q6H PRN Salary, Montell D, MD       Or  . acetaminophen (TYLENOL) suppository 650 mg  650 mg Rectal Q6H PRN Salary, Montell D, MD      . acetaminophen (TYLENOL) tablet 975 mg  975 mg Oral Q6H PRN Salary, Montell D, MD      . albuterol (PROVENTIL HFA;VENTOLIN HFA) 108 (90 Base) MCG/ACT inhaler 2 puff  2 puff Inhalation Q2H PRN Salary, Montell D, MD      . Derrill Memo ON 03/22/2018] azithromycin (ZITHROMAX) tablet 250 mg  250 mg Oral Daily Salary, Montell D, MD      . calcium carbonate (TUMS - dosed in mg elemental calcium) chewable tablet 200 mg of elemental calcium  1 tablet Oral Q8H PRN Salary, Montell D, MD      . enoxaparin (LOVENOX) injection 40 mg  40 mg Subcutaneous Q24H Salary, Montell D, MD      . famotidine (PEPCID) IVPB 20 mg premix  20 mg Intravenous Q12H Salary, Montell D, MD      . Derrill Memo ON 03/22/2018] ferrous sulfate EC tablet 325 mg  325 mg Oral BH-q7a Salary, Montell D, MD      . guaiFENesin (MUCINEX) 12 hr tablet 600 mg  600 mg Oral BID Salary, Montell D, MD      . hydrALAZINE (APRESOLINE) injection 10 mg  10 mg Intravenous Q4H PRN  Salary, Montell D, MD      . lactated ringers infusion   Intravenous Continuous Salary, Montell D, MD      . lidocaine (LIDODERM) 5 % 1 patch  1 patch Transdermal Q24H Salary, Montell D, MD      . loratadine (CLARITIN) chewable tablet 5 mg  5 mg Oral Daily Salary, Montell D, MD      . Melatonin TABS 3 mg  3 mg Oral QHS Salary, Montell D, MD      . methylPREDNISolone sodium succinate (SOLU-MEDROL) 125 mg/2 mL injection 60 mg  60 mg Intravenous Q6H Salary, Montell D, MD   Stopped at 03/21/18 1537  . metoprolol tartrate (LOPRESSOR) tablet 25 mg  25 mg Oral BID Salary, Montell D, MD      . mometasone-formoterol (DULERA) 200-5 MCG/ACT inhaler 2 puff  2 puff Inhalation BID Salary, Avel Peace, MD      . Derrill Memo ON 03/22/2018] multivitamin with minerals tablet 1 tablet  1 tablet Oral BH-q7a  Salary, Montell D, MD      . ondansetron (ZOFRAN) tablet 4 mg  4 mg Oral Q6H PRN Salary, Montell D, MD       Or  . ondansetron (ZOFRAN) injection 4 mg  4 mg Intravenous Q6H PRN Salary, Montell D, MD      . oxyCODONE (Oxy IR/ROXICODONE) immediate release tablet 5 mg  5 mg Oral Q6H PRN Salary, Montell D, MD      . Polyethyl Glycol-Propyl Glycol 0.4-0.3 % SOLN 1 drop  1 drop Both Eyes Daily PRN Salary, Montell D, MD      . polyethylene glycol (MIRALAX / GLYCOLAX) packet 17 g  17 g Oral Daily PRN Salary, Montell D, MD      . pregabalin (LYRICA) capsule 150 mg  150 mg Oral Q12H Salary, Montell D, MD      . QUEtiapine (SEROQUEL) tablet 25 mg  25 mg Oral QHS Salary, Montell D, MD      . sennosides-docusate sodium (SENOKOT-S) 8.6-50 MG tablet 2 tablet  2 tablet Oral BID Salary, Montell D, MD      . sertraline (ZOLOFT) tablet 50 mg  50 mg Oral Daily Salary, Montell D, MD      . sodium chloride flush (NS) 0.9 % injection 3 mL  3 mL Intravenous Q12H Salary, Montell D, MD         Discharge Medications: Please see discharge summary for a list of discharge medications.  Relevant Imaging Results:  Relevant Lab Results:   Additional Information SSN [id number]  Joana Reamer, [place]

## 2018-03-21 NOTE — ED Notes (Signed)
Pt O2 sat dropped to 88% on room air. Pt placed on 2 liter O2 via [place]

## 2018-03-21 NOTE — Progress Notes (Signed)
Code status was addressed with the patient and her son and daughter in law.  Patient is supposed to be a DNR.  This was relayed to Dr Katheren ShamsSalary and he has put in an order for the patient to be a DNR

## 2018-03-22 DIAGNOSIS — J441 Chronic obstructive pulmonary disease with (acute) exacerbation: Secondary | ICD-10-CM | POA: Diagnosis not present

## 2018-03-22 MED ORDER — AZITHROMYCIN 250 MG PO TABS: ORAL_TABLET | ORAL | 0 refills | Status: DC

## 2018-03-22 MED ORDER — METOPROLOL TARTRATE 25 MG PO TABS: 25.0000 mg | ORAL_TABLET | Freq: Two times a day (BID) | ORAL | 0 refills | Status: AC

## 2018-03-22 MED ORDER — PREDNISONE 50 MG PO TABS: 20.0000 mg | ORAL_TABLET | Freq: Every day | ORAL | 0 refills | Status: AC

## 2018-03-22 NOTE — Clinical Social Work Note (Signed)
The patient will discharge today via [facility] transport [facility]. The patient's family and facility are aware and in agreement. The CSW will deliver the discharge packet once the FL 2 is available, at which point the CSW will sign off. Please consult should additional needs arise.  Argentina PonderKaren Martha Autry Droege, MSW, Theresia MajorsLCSWA [phone]

## 2018-03-22 NOTE — NC FL2 (Signed)
[place] LEVEL OF CARE SCREENING TOOL     IDENTIFICATION  Patient Name: Heather [name] Birthdate: 07/09/1941 Sex: female Admission Date (Current Location): 03/21/2018  [place] and [place] Number:  Engineering geologist and Address:  [facility], [address], [place], [place] [address]      Provider Number: [id number]  Attending Physician Name and Address:  Gorden Harms, MD  Relative Name and Phone Number:       Current Level of Care: Hospital Recommended Level of Care: [place] Prior Approval Number:    Date Approved/Denied:   PASRR Number:    Discharge Plan: Domiciliary (Rest home)    Current Diagnoses: Patient Active Problem List   Diagnosis Date Noted  . COPD (chronic obstructive pulmonary disease) ([place]) 03/21/2018    Orientation RESPIRATION BLADDER Height & Weight     Self, Time  O2(2 liters) Incontinent Weight: 143 lb 4.8 oz (65 kg) Height:  5' 5"  (165.1 cm)  BEHAVIORAL SYMPTOMS/MOOD NEUROLOGICAL BOWEL NUTRITION STATUS      Incontinent Diet(normal)  AMBULATORY STATUS COMMUNICATION OF NEEDS Skin   Supervision Verbally                         Personal Care Assistance Level of Assistance  Bathing, Feeding, Dressing, Total care Bathing Assistance: Limited assistance Feeding assistance: Independent Dressing Assistance: Limited assistance Total Care Assistance: Limited assistance   Functional Limitations Info  Hearing, Speech, Sight Sight Info: Impaired(Mac Degeneration) Hearing Info: Adequate Speech Info: Adequate    SPECIAL CARE FACTORS FREQUENCY                       Contractures Contractures Info: Not present    Additional Factors Info  Code Status, Allergies Code Status Info: Full Allergies Info: none listed           Current Medications (03/22/2018):  This is the current hospital active medication list Current Facility-Administered Medications  Medication Dose  Route Frequency Provider Last Rate Last Dose  . acetaminophen (TYLENOL) tablet 650 mg  650 mg Oral Q6H PRN Salary, Montell D, MD       Or  . acetaminophen (TYLENOL) suppository 650 mg  650 mg Rectal Q6H PRN Salary, Montell D, MD      . albuterol (PROVENTIL) (2.5 MG/3ML) 0.083% nebulizer solution 2.5 mg  2.5 mg Inhalation Q2H PRN Salary, Montell D, MD      . azithromycin (ZITHROMAX) tablet 250 mg  250 mg Oral Daily Salary, Montell D, MD   250 mg at 03/22/18 0920  . calcium carbonate (TUMS - dosed in mg elemental calcium) chewable tablet 200 mg of elemental calcium  1 tablet Oral Q8H PRN Salary, Montell D, MD      . enoxaparin (LOVENOX) injection 40 mg  40 mg Subcutaneous Q24H Salary, Montell D, MD   40 mg at 03/21/18 2133  . famotidine (PEPCID) IVPB 20 mg premix  20 mg Intravenous Q12H Salary, Avel Peace, MD   Stopped at 03/22/18 1010  . ferrous sulfate tablet 325 mg  325 mg Oral BH-q7a Salary, Holly Bodily D, MD   325 mg at 03/22/18 0177  . guaiFENesin (MUCINEX) 12 hr tablet 600 mg  600 mg Oral BID Salary, Montell D, MD   600 mg at 03/22/18 0920  . hydrALAZINE (APRESOLINE) injection 10 mg  10 mg Intravenous Q4H PRN Salary, Montell D, MD      . lidocaine (LIDODERM) 5 %  1 patch  1 patch Transdermal Q24H Loney Hering D, MD   1 patch at 03/22/18 0756  . loratadine (CLARITIN) tablet 5 mg  5 mg Oral Daily Salary, Montell D, MD   5 mg at 03/22/18 0921  . Melatonin TABS 5 mg  5 mg Oral QHS Salary, Montell D, MD   5 mg at 03/21/18 2134  . methylPREDNISolone sodium succinate (SOLU-MEDROL) 125 mg/2 mL injection 60 mg  60 mg Intravenous Q6H Salary, Montell D, MD   60 mg at 03/22/18 0921  . metoprolol tartrate (LOPRESSOR) tablet 25 mg  25 mg Oral BID Loney Hering D, MD   25 mg at 03/22/18 4888  . mometasone-formoterol (DULERA) 200-5 MCG/ACT inhaler 2 puff  2 puff Inhalation BID Loney Hering D, MD   2 puff at 03/22/18 0757  . multivitamin with minerals tablet 1 tablet  1 tablet Oral Gara Kroner D,  MD   1 tablet at 03/22/18 [phone]  . ondansetron (ZOFRAN) tablet 4 mg  4 mg Oral Q6H PRN Salary, Montell D, MD       Or  . ondansetron (ZOFRAN) injection 4 mg  4 mg Intravenous Q6H PRN Salary, Montell D, MD      . oxyCODONE (Oxy IR/ROXICODONE) immediate release tablet 5 mg  5 mg Oral Q6H PRN Salary, Montell D, MD      . polyethylene glycol (MIRALAX / GLYCOLAX) packet 17 g  17 g Oral Daily PRN Salary, Montell D, MD      . polyvinyl alcohol (LIQUIFILM TEARS) 1.4 % ophthalmic solution 1 drop  1 drop Both Eyes Daily PRN Salary, Montell D, MD      . pregabalin (LYRICA) capsule 150 mg  150 mg Oral Q12H Salary, Montell D, MD   150 mg at 03/22/18 0921  . QUEtiapine (SEROQUEL) tablet 25 mg  25 mg Oral QHS Salary, Holly Bodily D, MD   25 mg at 03/21/18 2134  . senna-docusate (Senokot-S) tablet 2 tablet  2 tablet Oral BID Gorden Harms, MD   2 tablet at 03/22/18 [phone]  . sertraline (ZOLOFT) tablet 50 mg  50 mg Oral Daily Salary, Montell D, MD   50 mg at 03/22/18 0920  . sodium chloride flush (NS) 0.9 % injection 3 mL  3 mL Intravenous Q12H Salary, Montell D, MD   3 mL at 03/22/18 0922     Discharge Medications: Medication List    TAKE these medications   acetaminophen 325 MG tablet Commonly known as:  TYLENOL Take 975 mg by mouth every 6 (six) hours as needed.   albuterol 108 (90 Base) MCG/ACT inhaler Commonly known as:  PROVENTIL HFA;VENTOLIN HFA Inhale 1 puff into the lungs every 6 (six) hours as needed for wheezing or shortness of breath.   azithromycin 250 MG tablet Commonly known as:  ZITHROMAX 1 po daily Start taking on:  03/23/2018   calcium carbonate 500 MG chewable tablet Commonly known as:  TUMS - dosed in mg elemental calcium Chew 1 tablet by mouth every 8 (eight) hours as needed for indigestion or heartburn.   ferrous sulfate 325 (65 FE) MG EC tablet Take 325 mg by mouth every morning.   Fluticasone-Salmeterol 250-50 MCG/DOSE Aepb Commonly known as:  ADVAIR Inhale 1 puff  into the lungs every 12 (twelve) hours.   lidocaine 5 % Commonly known as:  LIDODERM Place 1 patch onto the skin daily. Remove & Discard patch within 12 hours or as directed by MD   loratadine 5 MG chewable  tablet Commonly known as:  CLARITIN Chew 5 mg by mouth daily.   Melatonin 3 MG Tabs Take 3 mg by mouth at bedtime.   metoprolol tartrate 25 MG tablet Commonly known as:  LOPRESSOR Take 1 tablet (25 mg total) by mouth 2 (two) times daily.   multivitamin with minerals Tabs tablet Take 1 tablet by mouth every morning.   omeprazole 40 MG capsule Commonly known as:  PRILOSEC Take 40 mg by mouth every morning.   oxyCODONE 5 MG immediate release tablet Commonly known as:  Oxy IR/ROXICODONE Take 5 mg by mouth every 6 (six) hours as needed for severe pain.   polyethylene glycol packet Commonly known as:  MIRALAX / GLYCOLAX Take 17 g by mouth daily as needed.   predniSONE 50 MG tablet Commonly known as:  DELTASONE Take 0.5 tablets (25 mg total) by mouth daily.   pregabalin 150 MG capsule Commonly known as:  LYRICA Take 150 mg by mouth every 12 (twelve) hours.   QUEtiapine 25 MG tablet Commonly known as:  SEROQUEL Take 25 mg by mouth at bedtime.   ranitidine 75 MG tablet Commonly known as:  ZANTAC Take 75 mg by mouth every 12 (twelve) hours as needed for heartburn.   sennosides-docusate sodium 8.6-50 MG tablet Commonly known as:  SENOKOT-S Take 2 tablets by mouth 2 (two) times daily.   sertraline 50 MG tablet Commonly known as:  ZOLOFT Take 50 mg by mouth daily.   SYSTANE 0.4-0.3 % Soln Generic drug:  Polyethyl Glycol-Propyl Glycol Place 1 drop into both eyes daily as needed.      Relevant Imaging Results:  Relevant Lab Results:   Additional Information SSN [id number]  Zettie Pho, LCSW

## 2018-03-22 NOTE — Evaluation (Addendum)
Physical Therapy Evaluation Patient Details Name: Heather [name] MRN: [id number] DOB: [birth year] Today's Date: 03/22/2018   History of Present Illness  Pt admitted for COPD exacerbation with complaints of SOB symptoms. History includes TBI in 10/2017, GERD, and CVA. Pt revently went to SNF and now has been living at ALF. Pt is currently at 92% on RA upon arrival.  Clinical Impression  Pt is a pleasant 77 year old female who was admitted for COPD exacerbation. Pt performs bed mobility/transfers with min assist and ambulation with cga and RW. Pt demonstrates deficits with strength/balance/cognition. Performed 5 time sit<>Stand in 34 seconds demonstrating decreased power/endurance. All mobility performed on room air with O2 sats WNL. Pt very tearful at times reporting she is worried about running out of money and wants to be as independent as possible. Would benefit from skilled PT to address above deficits and promote optimal return to PLOF. Recommend transition to HHPT upon discharge from acute hospitalization. Will need increased supervision for OOB mobility at this time due to safety concerns.       Follow Up Recommendations Home health PT;Supervision for mobility/OOB(return to ALF)    Equipment Recommendations  None recommended by PT    Recommendations for Other Services       Precautions / Restrictions Precautions Precautions: Fall Restrictions Weight Bearing Restrictions: No      Mobility  Bed Mobility Overal bed mobility: Needs Assistance Bed Mobility: Supine to Sit     Supine to sit: Min assist     General bed mobility comments: needs assist for sliding B LEs off bed and trunk positioning. Pt able to scoot out towards EOB without assistance. Reports this is baseline  Transfers Overall transfer level: Needs assistance Equipment used: Rolling walker (2 wheeled) Transfers: Sit to/from Stand Sit to Stand: Min assist         General transfer comment: needs assist for  intital standing with decreased power noted. ONce standing, tends to brace B LE against bed due to post lean, able to correct with cues  Ambulation/Gait Ambulation/Gait assistance: Min guard Ambulation Distance (Feet): 3 Feet Assistive device: Rolling walker (2 wheeled) Gait Pattern/deviations: Step-to pattern     General Gait Details: ambulated to recliner, however needs cues for safety/balance. Further ambulation not indicated.  Stairs            Wheelchair Mobility    Modified Rankin (Stroke Patients Only)       Balance Overall balance assessment: Needs assistance Sitting-balance support: Feet supported Sitting balance-Leahy Scale: Good     Standing balance support: Bilateral upper extremity supported Standing balance-Leahy Scale: Fair Standing balance comment: braces B LE against bed                             Pertinent Vitals/Pain Pain Assessment: No/denies pain    Home Living Family/patient expects to be discharged to:: Assisted living               Home Equipment: Walker - 4 wheels;Walker - 2 wheels      Prior Function Level of Independence: Independent with assistive device(s)         Comments: recently has been able to ambulate with rollater without assistance. Staff there are able to provide assistance as needed     Hand Dominance        Extremity/Trunk Assessment   Upper Extremity Assessment Upper Extremity Assessment: Generalized weakness(B UE grossly 4/5)    Lower Extremity Assessment  Lower Extremity Assessment: Generalized weakness(B LE grossly 4/5)       Communication   Communication: No difficulties  Cognition Arousal/Alertness: Awake/alert Behavior During Therapy: WFL for tasks assessed/performed Overall Cognitive Status: History of cognitive impairments - at baseline(tearful at times)                                        General Comments      Exercises Other Exercises Other  Exercises: seated ther-ex performed on B LE including LAQ, SLRs, hip abd/add, ankle pumps, and hip add squeezes. All ther-ex performed x 10 reps with cga and safe technique Other Exercises: performed 5 time sit<>Stand test in 34 seconds   Assessment/Plan    PT Assessment Patient needs continued PT services  PT Problem List Decreased strength;Decreased balance;Decreased mobility;Decreased cognition;Decreased safety awareness       PT Treatment Interventions Gait training;DME instruction;Therapeutic exercise;Balance training    PT Goals (Current goals can be found in the Care Plan section)  Acute Rehab PT Goals Patient Stated Goal: to go back to ALF PT Goal Formulation: With patient Time For Goal Achievement: 04/05/18 Potential to Achieve Goals: Good    Frequency Min 2X/week   Barriers to discharge        Co-evaluation               AM-PAC PT "6 Clicks" Daily Activity  Outcome Measure Difficulty turning over in bed (including adjusting bedclothes, sheets and blankets)?: Unable Difficulty moving from lying on back to sitting on the side of the bed? : Unable Difficulty sitting down on and standing up from a chair with arms (e.g., wheelchair, bedside commode, etc,.)?: Unable Help needed moving to and from a bed to chair (including a wheelchair)?: A Little Help needed walking in hospital room?: A Little Help needed climbing 3-5 steps with a railing? : A Lot 6 Click Score: 11    End of Session Equipment Utilized During Treatment: Gait belt Activity Tolerance: Patient tolerated treatment well Patient left: in chair;with chair alarm set Nurse Communication: Mobility status PT Visit Diagnosis: Unsteadiness on feet (R26.81);Muscle weakness (generalized) (M62.81);Difficulty in walking, not elsewhere classified (R26.2)    Time: [id number] PT Time Calculation (min) (ACUTE ONLY): 30 min   Charges:   PT Evaluation $PT Eval Low Complexity: 1 Low PT Treatments $Therapeutic  Exercise: 8-22 mins   PT G CodesElizabeth Palau:        Tarra Pence, PT, DPT [phone]   Keyaan Lederman 03/22/2018, 12:24 PM

## 2018-03-22 NOTE — Progress Notes (Signed)
Heather [name]  A and O x 2. VSS. Pt tolerating diet well. No complaints of pain or nausea. IV removed intact, prescriptions given. Pt and family  voiced understanding of discharge instructions with no further questions. Pt discharged via wheelchair with nurse tech to [facility]. Pt's family provided transport for the pt.     Allergies as of 03/22/2018   No Known Allergies     Medication List    TAKE these medications   acetaminophen 325 MG tablet Commonly known as:  TYLENOL Take 975 mg by mouth every 6 (six) hours as needed.   albuterol 108 (90 Base) MCG/ACT inhaler Commonly known as:  PROVENTIL HFA;VENTOLIN HFA Inhale 1 puff into the lungs every 6 (six) hours as needed for wheezing or shortness of breath.   azithromycin 250 MG tablet Commonly known as:  ZITHROMAX 1 po daily Start taking on:  03/23/2018   calcium carbonate 500 MG chewable tablet Commonly known as:  TUMS - dosed in mg elemental calcium Chew 1 tablet by mouth every 8 (eight) hours as needed for indigestion or heartburn.   ferrous sulfate 325 (65 FE) MG EC tablet Take 325 mg by mouth every morning.   Fluticasone-Salmeterol 250-50 MCG/DOSE Aepb Commonly known as:  ADVAIR Inhale 1 puff into the lungs every 12 (twelve) hours.   lidocaine 5 % Commonly known as:  LIDODERM Place 1 patch onto the skin daily. Remove & Discard patch within 12 hours or as directed by MD   loratadine 5 MG chewable tablet Commonly known as:  CLARITIN Chew 5 mg by mouth daily.   Melatonin 3 MG Tabs Take 3 mg by mouth at bedtime.   metoprolol tartrate 25 MG tablet Commonly known as:  LOPRESSOR Take 1 tablet (25 mg total) by mouth 2 (two) times daily.   multivitamin with minerals Tabs tablet Take 1 tablet by mouth every morning.   omeprazole 40 MG capsule Commonly known as:  PRILOSEC Take 40 mg by mouth every morning.   oxyCODONE 5 MG immediate release tablet Commonly known as:  Oxy IR/ROXICODONE Take 5 mg by mouth every 6 (six)  hours as needed for severe pain.   polyethylene glycol packet Commonly known as:  MIRALAX / GLYCOLAX Take 17 g by mouth daily as needed.   predniSONE 50 MG tablet Commonly known as:  DELTASONE Take 0.5 tablets (25 mg total) by mouth daily.   pregabalin 150 MG capsule Commonly known as:  LYRICA Take 150 mg by mouth every 12 (twelve) hours.   QUEtiapine 25 MG tablet Commonly known as:  SEROQUEL Take 25 mg by mouth at bedtime.   ranitidine 75 MG tablet Commonly known as:  ZANTAC Take 75 mg by mouth every 12 (twelve) hours as needed for heartburn.   sennosides-docusate sodium 8.6-50 MG tablet Commonly known as:  SENOKOT-S Take 2 tablets by mouth 2 (two) times daily.   sertraline 50 MG tablet Commonly known as:  ZOLOFT Take 50 mg by mouth daily.   SYSTANE 0.4-0.3 % Soln Generic drug:  Polyethyl Glycol-Propyl Glycol Place 1 drop into both eyes daily as needed.       Vitals:   03/22/18 0455 03/22/18 0919  BP: (!) 148/67 137/71  Pulse: 73 78  Resp: 20 18  Temp: 98 F (36.7 C) 97.8 F (36.6 C)  SpO2: 95% 92%    Heather CloudJuan G Rodriguez [name]

## 2018-03-22 NOTE — Discharge Summary (Addendum)
[facility] -  at [facility]   PATIENT NAME: Heather [name]    MR#:  [id number]  DATE OF BIRTH:  [birth year]  DATE OF ADMISSION:  03/21/2018 ADMITTING PHYSICIAN: Bertrum Sol, MD  DATE OF DISCHARGE: No discharge date for patient encounter.  PRIMARY CARE PHYSICIAN: System, Pcp Not In    ADMISSION DIAGNOSIS:  COPD exacerbation (HCC) [J44.1] Acute bronchitis, unspecified organism [J20.9]  DISCHARGE DIAGNOSIS:  Active Problems:   COPD (chronic obstructive pulmonary disease) (HCC)   SECONDARY DIAGNOSIS:   Past Medical History:  Diagnosis Date  . Hypertension     HOSPITAL COURSE:  1 acute on COPD exacerbation Resolved Admitted to regular nursing for bed, treated with empiric azithromycin, IV Solu-Medrol with tapering prednisone, successfully weaned off oxygen, provided aggressive pulmonary toilet with bronchodilator therapy, inhaled corticosteroids twice daily, mucolytic agents, respiratory therapy did see patient while in house, and patient did well  2 chronic benign essential hypertension  Controlled on current regiment  3 chronic GERD with large hiatal hernia Stable Provided Pepcid IV twice daily  4 acute on chronic moderate protein energy malnutrition Dietary consulted while in house   5 acute elevated d-dimer CT chest noted, negative for PE  6 acute left adrenal mass Incidentally found on CT of the chest Patient not interested in any further workup/evaluation, patient not interested in surgery or outpatient follow-up of any kind given clinical conditioning/advanced age   DISCHARGE CONDITIONS:  On the day of discharge patient is afebrile, hemodynamically stable, much improved, ready for discharge back to assisted living facility with resumption of home health services, follow with primary care provider as directed, for more specific details please see chart  CONSULTS OBTAINED:    DRUG ALLERGIES:  No Known  Allergies  DISCHARGE MEDICATIONS:   Allergies as of 03/22/2018   No Known Allergies     Medication List    TAKE these medications   acetaminophen 325 MG tablet Commonly known as:  TYLENOL Take 975 mg by mouth every 6 (six) hours as needed.   albuterol 108 (90 Base) MCG/ACT inhaler Commonly known as:  PROVENTIL HFA;VENTOLIN HFA Inhale 1 puff into the lungs every 6 (six) hours as needed for wheezing or shortness of breath.   azithromycin 250 MG tablet Commonly known as:  ZITHROMAX 1 po daily Start taking on:  03/23/2018   calcium carbonate 500 MG chewable tablet Commonly known as:  TUMS - dosed in mg elemental calcium Chew 1 tablet by mouth every 8 (eight) hours as needed for indigestion or heartburn.   ferrous sulfate 325 (65 FE) MG EC tablet Take 325 mg by mouth every morning.   Fluticasone-Salmeterol 250-50 MCG/DOSE Aepb Commonly known as:  ADVAIR Inhale 1 puff into the lungs every 12 (twelve) hours.   lidocaine 5 % Commonly known as:  LIDODERM Place 1 patch onto the skin daily. Remove & Discard patch within 12 hours or as directed by MD   loratadine 5 MG chewable tablet Commonly known as:  CLARITIN Chew 5 mg by mouth daily.   Melatonin 3 MG Tabs Take 3 mg by mouth at bedtime.   metoprolol tartrate 25 MG tablet Commonly known as:  LOPRESSOR Take 1 tablet (25 mg total) by mouth 2 (two) times daily.   multivitamin with minerals Tabs tablet Take 1 tablet by mouth every morning.   omeprazole 40 MG capsule Commonly known as:  PRILOSEC Take 40 mg by mouth every morning.   oxyCODONE 5 MG immediate release tablet Commonly known  as:  Oxy IR/ROXICODONE Take 5 mg by mouth every 6 (six) hours as needed for severe pain.   polyethylene glycol packet Commonly known as:  MIRALAX / GLYCOLAX Take 17 g by mouth daily as needed.   predniSONE 50 MG tablet Commonly known as:  DELTASONE Take 0.5 tablets (25 mg total) by mouth daily.   pregabalin 150 MG capsule Commonly  known as:  LYRICA Take 150 mg by mouth every 12 (twelve) hours.   QUEtiapine 25 MG tablet Commonly known as:  SEROQUEL Take 25 mg by mouth at bedtime.   ranitidine 75 MG tablet Commonly known as:  ZANTAC Take 75 mg by mouth every 12 (twelve) hours as needed for heartburn.   sennosides-docusate sodium 8.6-50 MG tablet Commonly known as:  SENOKOT-S Take 2 tablets by mouth 2 (two) times daily.   sertraline 50 MG tablet Commonly known as:  ZOLOFT Take 50 mg by mouth daily.   SYSTANE 0.4-0.3 % Soln Generic drug:  Polyethyl Glycol-Propyl Glycol Place 1 drop into both eyes daily as needed.        DISCHARGE INSTRUCTIONS:    If you experience worsening of your admission symptoms, develop shortness of breath, life threatening emergency, suicidal or homicidal thoughts you must seek medical attention immediately by calling 911 or calling your MD immediately  if symptoms less severe.  You Must read complete instructions/literature along with all the possible adverse reactions/side effects for all the Medicines you take and that have been prescribed to you. Take any new Medicines after you have completely understood and accept all the possible adverse reactions/side effects.   Please note  You were cared for by a hospitalist during your hospital stay. If you have any questions about your discharge medications or the care you received while you were in the hospital after you are discharged, you can call the unit and asked to speak with the hospitalist on call if the hospitalist that took care of you is not available. Once you are discharged, your primary care physician will handle any further medical issues. Please note that NO REFILLS for any discharge medications will be authorized once you are discharged, as it is imperative that you return to your primary care physician (or establish a relationship with a primary care physician if you do not have one) for your aftercare needs so that they  can reassess your need for medications and monitor your lab values.    Today   CHIEF COMPLAINT:   Chief Complaint  Patient presents with  . Shortness of Breath    HISTORY OF PRESENT ILLNESS:  77 y.o. 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  VITAL SIGNS:  Blood pressure 137/71, pulse 78, temperature 97.8 F (36.6 C), temperature source Oral, resp. rate 18, height 5\' 5"  (1.651 m), weight 65 kg (143 lb 4.8 oz), SpO2 92 %.  I/O:    Intake/Output Summary (Last 24 hours) at  03/22/2018 1102 Last data filed at 03/22/2018 1032 Gross per 24 hour  Intake 583 ml  Output 400 ml  Net 183 ml    PHYSICAL EXAMINATION:  GENERAL:  77 y.o.-year-old patient lying in the bed with no acute distress.  EYES: Pupils equal, round, reactive to light and accommodation. No scleral icterus. Extraocular muscles intact.  HEENT: Head atraumatic, normocephalic. Oropharynx and nasopharynx clear.  NECK:  Supple, no jugular venous distention. No thyroid enlargement, no tenderness.  LUNGS: Normal breath sounds bilaterally, no wheezing, rales,rhonchi or crepitation. No use of accessory muscles of respiration.  CARDIOVASCULAR: S1, S2 normal. No murmurs, rubs, or gallops.  ABDOMEN: Soft, non-tender, non-distended. Bowel sounds present. No organomegaly or mass.   EXTREMITIES: No pedal edema, cyanosis, or clubbing.  NEUROLOGIC: Cranial nerves II through XII are intact. Muscle strength 5/5 in all extremities. Sensation intact. Gait not checked.  PSYCHIATRIC: The patient is alert and oriented x 3.  SKIN: No obvious rash, lesion, or ulcer.   DATA REVIEW:   CBC Recent Labs  Lab 03/21/18 1146  WBC 13.2*  HGB 12.8  HCT 39.3  PLT 289    Chemistries  Recent Labs  Lab 03/21/18 1146  NA 143  K 3.9  CL 102  CO2 30  GLUCOSE 103*  BUN 12  CREATININE 0.65  CALCIUM 9.9    Cardiac Enzymes Recent Labs  Lab 03/21/18 1146  TROPONINI <0.03    Microbiology Results  Results for orders placed or performed during the hospital encounter of 03/21/18  MRSA PCR Screening     Status: None   Collection Time: 03/21/18  6:13 PM  Result Value Ref Range Status   MRSA by PCR NEGATIVE NEGATIVE Final    Comment:        The GeneXpert MRSA Assay (FDA approved for NASAL specimens only), is one component of a comprehensive MRSA colonization surveillance program. It is not intended to diagnose MRSA infection nor to guide or monitor treatment for MRSA infections. Performed at [facility], [address] [place], [place] [address]     RADIOLOGY:  Dg Chest 2 View  Result Date: 03/21/2018 CLINICAL DATA:  Respiratory distress.  COPD. EXAM: CHEST - 2 VIEW COMPARISON:  No prior. FINDINGS: Mediastinum and hilar structures normal. Lungs are clear. No pleural effusion or pneumothorax. Heart size normal. Sliding hiatal hernia. No acute bony abnormality. IMPRESSION: 1.  No acute cardiopulmonary disease. 2.  Sliding hiatal hernia. Electronically Signed   By: Maisie Fus  Register   On: 03/21/2018 13:08   Ct Angio Chest Pe W And/or Wo Contrast  Result Date: 03/21/2018 CLINICAL DATA:  Respiratory distress.  History of COPD. EXAM: CT ANGIOGRAPHY CHEST WITH CONTRAST TECHNIQUE: Multidetector CT imaging of the chest was performed using the standard protocol  during bolus administration of intravenous contrast. Multiplanar CT image reconstructions and MIPs were obtained to evaluate the vascular anatomy. CONTRAST:  75mL ISOVUE-370 IOPAMIDOL (ISOVUE-370) INJECTION 76% COMPARISON:  None. FINDINGS: Cardiovascular: There is no pulmonary embolism identified within the main, lobar or segmental pulmonary arteries bilaterally. Heart size is upper normal. No pericardial effusion. Aortic atherosclerosis. No thoracic aortic aneurysm or evidence of aortic dissection. Mediastinum/Nodes: Hiatal hernia, moderate to large. No enlarged lymph nodes seen within the mediastinum or perihilar regions. Esophagus is unremarkable. Trachea and central bronchi are unremarkable. Lungs/Pleura: Mild atelectasis bilaterally. Bilateral upper lobe emphysema, mild to moderate in degree. No evidence of pneumonia. No pleural effusion or pneumothorax. Upper Abdomen: Left adrenal mass measures approximately 3 cm greatest dimension, with  Hounsfield unit measurements of 15-23. Musculoskeletal: Thoracic spine kyphosis. No acute or suspicious osseous finding. Review of the MIP images confirms the above findings. IMPRESSION: 1. No acute findings. No pulmonary embolism seen. No pneumonia, pleural effusion or pneumothorax. 2. **An incidental finding of potential clinical significance has been found. Left adrenal mass measuring approximately 3 cm greatest dimension, with indeterminate Hounsfield unit measurements of 15-23. Per consensus guidelines, recommend nonemergent adrenal protocol CT for further characterization.** 3. Hiatal hernia, moderate to large in size. 4. Bilateral upper lobe emphysematous change, mild to moderate in degree. 5. Aortic atherosclerosis. Aortic Atherosclerosis (ICD10-I70.0) and Emphysema (ICD10-J43.9). Electronically Signed   By: Bary RichardStan  Maynard M.D.   On: 03/21/2018 14:11    EKG:   Orders placed or performed during the hospital encounter of 03/21/18  . EKG 12-Lead  . EKG 12-Lead  .  EKG 12-Lead  . EKG 12-Lead      Management plans discussed with the patient, family and they are in agreement.  CODE STATUS:     Code Status Orders  (From admission, onward)        Start     Ordered   03/21/18 1719  Do not attempt resuscitation (DNR)  Continuous    Question Answer Comment  In the event of cardiac or respiratory ARREST Do not call a "code blue"   In the event of cardiac or respiratory ARREST Do not perform Intubation, CPR, defibrillation or ACLS   In the event of cardiac or respiratory ARREST Use medication by any route, position, wound care, and other measures to relive pain and suffering. May use oxygen, suction and manual treatment of airway obstruction as needed for comfort.      03/21/18 1718    Code Status History    Date Active Date Inactive Code Status Order ID Comments User Context   03/21/2018 1643 03/21/2018 1718 Full Code [id number]  SalaryEvelena Asa, Keilly Fatula D, MD Inpatient    Advance Directive Documentation     Most Recent Value  Type of Advance Directive  Healthcare Power of Attorney  Pre-existing out of facility DNR order (yellow form or pink MOST form)  -  "MOST" Form in Place?  -      TOTAL TIME TAKING CARE OF THIS PATIENT: 45 minutes.    Evelena AsaMontell D Audi Conover M.D on 03/22/2018 at 11:02 AM  Between 7am to 6pm - Pager - [phone]  After 6pm go to [email or web address] - password [email or web address]  [facility]  Office  [phone]  CC: Primary care physician; System, Pcp Not In   Note: This dictation was prepared with Dragon dictation along with smaller phrase technology. Any transcriptional errors that result from this process are unintentional.

## 2018-04-28 ENCOUNTER — Encounter: Payer: Self-pay | Admitting: Emergency Medicine

## 2018-04-28 ENCOUNTER — Other Ambulatory Visit: Payer: Self-pay

## 2018-04-28 ENCOUNTER — Emergency Department: Payer: Medicare Other

## 2018-04-28 ENCOUNTER — Emergency Department
Admission: EM | Admit: 2018-04-28 | Discharge: 2018-04-28 | Disposition: A | Discharge status: Home / Self Care | Payer: Medicare Other | Attending: Emergency Medicine | Admitting: Emergency Medicine

## 2018-04-28 DIAGNOSIS — I10 Essential (primary) hypertension: Secondary | ICD-10-CM | POA: Insufficient documentation

## 2018-04-28 DIAGNOSIS — Y939 Activity, unspecified: Secondary | ICD-10-CM | POA: Insufficient documentation

## 2018-04-28 DIAGNOSIS — S20229A Contusion of unspecified back wall of thorax, initial encounter: Secondary | ICD-10-CM

## 2018-04-28 DIAGNOSIS — Y929 Unspecified place or not applicable: Secondary | ICD-10-CM | POA: Diagnosis not present

## 2018-04-28 DIAGNOSIS — J449 Chronic obstructive pulmonary disease, unspecified: Secondary | ICD-10-CM | POA: Diagnosis not present

## 2018-04-28 DIAGNOSIS — W010XXA Fall on same level from slipping, tripping and stumbling without subsequent striking against object, initial encounter: Secondary | ICD-10-CM | POA: Diagnosis not present

## 2018-04-28 DIAGNOSIS — Z87891 Personal history of nicotine dependence: Secondary | ICD-10-CM | POA: Insufficient documentation

## 2018-04-28 DIAGNOSIS — Z79899 Other long term (current) drug therapy: Secondary | ICD-10-CM | POA: Diagnosis not present

## 2018-04-28 DIAGNOSIS — Y999 Unspecified external cause status: Secondary | ICD-10-CM | POA: Insufficient documentation

## 2018-04-28 DIAGNOSIS — S20409A Unspecified superficial injuries of unspecified back wall of thorax, initial encounter: Secondary | ICD-10-CM | POA: Diagnosis present

## 2018-04-28 DIAGNOSIS — W19XXXA Unspecified fall, initial encounter: Secondary | ICD-10-CM

## 2018-04-28 HISTORY — DX: Unspecified macular degeneration: H35.30

## 2018-04-28 HISTORY — DX: Chronic obstructive pulmonary disease, unspecified: J44.9

## 2018-04-28 NOTE — ED Triage Notes (Signed)
Pt to ED via EMS from [facility] c/o fall today, states was walking with her walker and tried backing up and tripped over her feet and fell back into the wall and slid down to the ground.  C/o pain to mid upper back, denies SOB, denies hitting head or LOC.  Presents A&Ox4, speaking in complete and coherent sentences, skin warm and dry, chest rise even and unlabored.

## 2018-04-28 NOTE — ED Provider Notes (Signed)
[facility] Emergency Department Provider Note  ___________________________________________   First MD Initiated Contact with Patient 04/28/18 1303     (approximate)  I have reviewed the triage vital signs and the nursing notes.   HISTORY  Chief Complaint Fall   HPI Heather [name] is a 77 y.o. female with a history of COPD on as needed nasal cannula oxygen as well as a left-sided hip fracture who walks with a walker who is presenting today after a fall.  The patient says that she was turning with her walker when she tripped over the walker and fell into the wall hitting her thoracic back.  Says that she has mild pain to her lower thoracic back at this time.  Did not hit her head or lose consciousness.  Was able to slide herself to the ground.  Denies any headache or neck pain.  Denies any hip pain.  Denies any weakness or nausea.  Patient says that she took Tylenol prior to arrival and is not requesting any pain medications at this time.  Past Medical History:  Diagnosis Date  . COPD (chronic obstructive pulmonary disease) (HCC)   . Hypertension   . Macular degeneration     Patient Active Problem List   Diagnosis Date Noted  . COPD (chronic obstructive pulmonary disease) (HCC) 03/21/2018    History reviewed. No pertinent surgical history.  Prior to Admission medications   Medication Sig Start Date End Date Taking? Authorizing Provider  acetaminophen (TYLENOL) 325 MG tablet Take 975 mg by mouth every 6 (six) hours as needed.    [provider]  albuterol (PROVENTIL HFA;VENTOLIN HFA) 108 (90 Base) MCG/ACT inhaler Inhale 1 puff into the lungs every 6 (six) hours as needed for wheezing or shortness of breath.    [provider]  azithromycin (ZITHROMAX) 250 MG tablet 1 po daily 03/23/18   Salary, Jetty Duhamel D, MD  calcium carbonate (TUMS - DOSED IN MG ELEMENTAL CALCIUM) 500 MG chewable tablet Chew 1 tablet by mouth every 8 (eight) hours as  needed for indigestion or heartburn.    [provider]  ferrous sulfate 325 (65 FE) MG EC tablet Take 325 mg by mouth every morning.    [provider]  Fluticasone-Salmeterol (ADVAIR) 250-50 MCG/DOSE AEPB Inhale 1 puff into the lungs every 12 (twelve) hours.    [provider]  lidocaine (LIDODERM) 5 % Place 1 patch onto the skin daily. Remove & Discard patch within 12 hours or as directed by MD    [provider]  loratadine (CLARITIN) 5 MG chewable tablet Chew 5 mg by mouth daily.    [provider]  Melatonin 3 MG TABS Take 3 mg by mouth at bedtime.    [provider]  metoprolol tartrate (LOPRESSOR) 25 MG tablet Take 1 tablet (25 mg total) by mouth 2 (two) times daily. 03/22/18   Salary, Evelena Asa, MD  Multiple Vitamin (MULTIVITAMIN WITH MINERALS) TABS tablet Take 1 tablet by mouth every morning.    [provider]  omeprazole (PRILOSEC) 40 MG capsule Take 40 mg by mouth every morning.    [provider]  oxyCODONE (OXY IR/ROXICODONE) 5 MG immediate release tablet Take 5 mg by mouth every 6 (six) hours as needed for severe pain.    [provider]  Polyethyl Glycol-Propyl Glycol (SYSTANE) 0.4-0.3 % SOLN Place 1 drop into both eyes daily as needed.    [provider]  polyethylene glycol (MIRALAX / GLYCOLAX) packet Take 17  g by mouth daily as needed.    [provider]  predniSONE (DELTASONE) 50 MG tablet Take 0.5 tablets (25 mg total) by mouth daily. 03/22/18 03/22/19  Salary, Evelena Asa, MD  pregabalin (LYRICA) 150 MG capsule Take 150 mg by mouth every 12 (twelve) hours.    [provider]  QUEtiapine (SEROQUEL) 25 MG tablet Take 25 mg by mouth at bedtime.    [provider]  ranitidine (ZANTAC) 75 MG tablet Take 75 mg by mouth every 12 (twelve) hours as needed for heartburn.    [provider]  sennosides-docusate sodium (SENOKOT-S) 8.6-50 MG tablet Take 2 tablets by mouth  2 (two) times daily.    [provider]  sertraline (ZOLOFT) 50 MG tablet Take 50 mg by mouth daily.    [provider]    Allergies Patient has no known allergies.  History reviewed. No pertinent family history.  Social History Social History   Tobacco Use  . Smoking status: Former Games developer  . Smokeless tobacco: Never Used  Substance Use Topics  . Alcohol use: Not Currently  . Drug use: Not Currently    Review of Systems  Constitutional: No fever/chills Eyes: No visual changes. ENT: No sore throat. Cardiovascular: Denies chest pain. Respiratory: Denies shortness of breath. Gastrointestinal: No abdominal pain.  No nausea, no vomiting.  No diarrhea.  No constipation. Genitourinary: Negative for dysuria. Musculoskeletal: As above Skin: Negative for rash. Neurological: Negative for headaches, focal weakness or numbness.   ____________________________________________   PHYSICAL EXAM:  VITAL SIGNS: ED Triage Vitals  Enc Vitals Group     BP 04/28/18 1305 (!) 186/94     Pulse Rate 04/28/18 1305 66     Resp 04/28/18 1305 14     Temp 04/28/18 1323 97.8 F (36.6 C)     Temp Source 04/28/18 1323 Oral     SpO2 04/28/18 1305 95 %     Weight 04/28/18 1307 135 lb (61.2 kg)     Height 04/28/18 1307  (1.651 m)     Head Circumference --      Peak Flow --      Pain Score 04/28/18 1306 4     Pain Loc --      Pain Edu? --      Excl. in GC? --     Constitutional: Alert and oriented. Well appearing and in no acute distress. Eyes: Conjunctivae are normal.  Head: Atraumatic. Nose: No congestion/rhinnorhea. Mouth/Throat: Mucous membranes are moist.  Neck: No stridor.  No tenderness to palpation to the midline cervical spine.  No deformity or step-off. Cardiovascular: Normal rate, regular rhythm. Grossly normal heart sounds.  Good peripheral circulation. Respiratory: Normal respiratory effort.  No retractions. Lungs CTAB. Gastrointestinal: Soft and  nontender. No distention. No CVA tenderness. Musculoskeletal: No lower extremity tenderness nor edema.  No joint effusions.  Minimal lower thoracic spine tenderness to palpation without any deformity or step-off.  No ecchymosis. Neurologic:  Normal speech and language. No gross focal neurologic deficits are appreciated. Skin:  Skin is warm, dry and intact. No rash noted. Psychiatric: Mood and affect are normal. Speech and behavior are normal.  ____________________________________________   LABS (all labs ordered are listed, but only abnormal results are displayed)  Labs Reviewed - No data to display ____________________________________________  EKG  ED ECG REPORT I, Arelia Longest, the attending physician, personally viewed and interpreted this ECG.   Date: 04/28/2018  EKG Time: 1257  Rate: 66  Rhythm: normal sinus rhythm  with PVC x1.  Axis: Normal  Intervals:none  ST&T Change: No ST segment elevation or depression.  No abnormal T wave inversion.  ____________________________________________  RADIOLOGY  No acute bony abnormality. ____________________________________________   PROCEDURES  Procedure(s) performed:   Procedures  Critical Care performed:   ____________________________________________   INITIAL IMPRESSION / ASSESSMENT AND PLAN / ED COURSE  Pertinent labs & imaging results that were available during my care of the patient were reviewed by me and considered in my medical decision making (see chart for details).  DDX: Mechanical fall, thoracic contusion, compression fracture, spinous process fracture, As part of my medical decision making, I reviewed the following data within the electronic MEDICAL RECORD NUMBER Notes from prior ED visits  ----------------------------------------- 3:12 PM on 04/28/2018 -----------------------------------------  Patient at this time resting comfortably with son at the bedside.  No acute finding on thoracic spine imaging.   Likely contusion without any need for further work-up or treatment at this time.  Patient will be discharged home. ____________________________________________   FINAL CLINICAL IMPRESSION(S) / ED DIAGNOSES  Mechanical fall.  Thoracic back contusion.  NEW MEDICATIONS STARTED DURING THIS VISIT:  New Prescriptions   No medications on file     Note:  This document was prepared using Dragon voice recognition software and may include unintentional dictation errors.     Myrna Blazer, MD 04/28/18 [id number]

## 2018-04-28 NOTE — ED Notes (Signed)
Patient discharged and instructions reviewed by Molli Hazard, RN task RN.

## 2018-11-06 ENCOUNTER — Other Ambulatory Visit
Admission: RE | Admit: 2018-11-06 | Discharge: 2018-11-06 | Disposition: A | Discharge status: Home / Self Care | Payer: Medicare Other | Source: Ambulatory Visit | Attending: Family Medicine | Admitting: Family Medicine

## 2018-11-06 DIAGNOSIS — J449 Chronic obstructive pulmonary disease, unspecified: Secondary | ICD-10-CM | POA: Insufficient documentation

## 2018-11-06 DIAGNOSIS — G4700 Insomnia, unspecified: Secondary | ICD-10-CM | POA: Diagnosis present

## 2018-11-06 DIAGNOSIS — R1312 Dysphagia, oropharyngeal phase: Secondary | ICD-10-CM | POA: Diagnosis present

## 2018-11-06 DIAGNOSIS — R498 Other voice and resonance disorders: Secondary | ICD-10-CM | POA: Insufficient documentation

## 2018-11-06 DIAGNOSIS — R2681 Unsteadiness on feet: Secondary | ICD-10-CM | POA: Diagnosis present

## 2018-11-06 DIAGNOSIS — Z5189 Encounter for other specified aftercare: Secondary | ICD-10-CM | POA: Insufficient documentation

## 2018-11-06 DIAGNOSIS — K5900 Constipation, unspecified: Secondary | ICD-10-CM | POA: Diagnosis present

## 2018-11-06 DIAGNOSIS — K219 Gastro-esophageal reflux disease without esophagitis: Secondary | ICD-10-CM | POA: Insufficient documentation

## 2018-11-06 DIAGNOSIS — S72002D Fracture of unspecified part of neck of left femur, subsequent encounter for closed fracture with routine healing: Secondary | ICD-10-CM | POA: Diagnosis present

## 2018-11-06 DIAGNOSIS — R52 Pain, unspecified: Secondary | ICD-10-CM | POA: Diagnosis present

## 2018-11-06 DIAGNOSIS — K5909 Other constipation: Secondary | ICD-10-CM | POA: Insufficient documentation

## 2018-11-06 DIAGNOSIS — F3289 Other specified depressive episodes: Secondary | ICD-10-CM | POA: Diagnosis present

## 2018-11-06 DIAGNOSIS — E569 Vitamin deficiency, unspecified: Secondary | ICD-10-CM | POA: Diagnosis present

## 2018-11-06 DIAGNOSIS — R41841 Cognitive communication deficit: Secondary | ICD-10-CM | POA: Diagnosis present

## 2018-11-06 DIAGNOSIS — F0281 Dementia in other diseases classified elsewhere with behavioral disturbance: Secondary | ICD-10-CM | POA: Diagnosis present

## 2018-11-06 DIAGNOSIS — S72012K Unspecified intracapsular fracture of left femur, subsequent encounter for closed fracture with nonunion: Secondary | ICD-10-CM | POA: Diagnosis present

## 2018-11-06 DIAGNOSIS — S6292XD Unspecified fracture of left wrist and hand, subsequent encounter for fracture with routine healing: Secondary | ICD-10-CM | POA: Diagnosis present

## 2018-11-06 DIAGNOSIS — T8131XD Disruption of external operation (surgical) wound, not elsewhere classified, subsequent encounter: Secondary | ICD-10-CM | POA: Insufficient documentation

## 2018-11-06 DIAGNOSIS — R110 Nausea: Secondary | ICD-10-CM | POA: Insufficient documentation

## 2018-11-06 DIAGNOSIS — F17299 Nicotine dependence, other tobacco product, with unspecified nicotine-induced disorders: Secondary | ICD-10-CM | POA: Insufficient documentation

## 2018-11-06 DIAGNOSIS — L89899 Pressure ulcer of other site, unspecified stage: Secondary | ICD-10-CM | POA: Diagnosis present

## 2018-11-06 DIAGNOSIS — H3530 Unspecified macular degeneration: Secondary | ICD-10-CM | POA: Diagnosis present

## 2018-11-06 DIAGNOSIS — S0990XD Unspecified injury of head, subsequent encounter: Secondary | ICD-10-CM | POA: Insufficient documentation

## 2018-11-06 DIAGNOSIS — L03116 Cellulitis of left lower limb: Secondary | ICD-10-CM | POA: Diagnosis present

## 2018-11-06 DIAGNOSIS — M6281 Muscle weakness (generalized): Secondary | ICD-10-CM | POA: Diagnosis present

## 2018-11-06 DIAGNOSIS — H04129 Dry eye syndrome of unspecified lacrimal gland: Secondary | ICD-10-CM | POA: Diagnosis present

## 2018-11-06 DIAGNOSIS — F0390 Unspecified dementia without behavioral disturbance: Secondary | ICD-10-CM | POA: Insufficient documentation

## 2018-11-06 DIAGNOSIS — N390 Urinary tract infection, site not specified: Secondary | ICD-10-CM | POA: Diagnosis present

## 2018-11-06 LAB — COMPREHENSIVE METABOLIC PANEL
ALT: 14 U/L (ref 0–44)
AST: 17 U/L (ref 15–41)
Albumin: 3.3 g/dL — ABNORMAL LOW (ref 3.5–5.0)
Alkaline Phosphatase: 98 U/L (ref 38–126)
Anion gap: 9 (ref 5–15)
BUN: 12 mg/dL (ref 8–23)
CHLORIDE: 106 mmol/L (ref 98–111)
CO2: 27 mmol/L (ref 22–32)
CREATININE: 0.51 mg/dL (ref 0.44–1.00)
Calcium: 9 mg/dL (ref 8.9–10.3)
GFR calc non Af Amer: >60 mL/min (ref >60)
GLUCOSE: 93 mg/dL (ref 70–99)
Potassium: 3.8 mmol/L (ref 3.5–5.1)
SODIUM: 142 mmol/L (ref 135–145)
Total Bilirubin: 0.6 mg/dL (ref 0.3–1.2)
Total Protein: 6.2 g/dL — ABNORMAL LOW (ref 6.5–8.1)

## 2018-11-06 LAB — URINALYSIS, COMPLETE (UACMP) WITH MICROSCOPIC
BACTERIA UA: NONE SEEN
BILIRUBIN URINE: NEGATIVE
Glucose, UA: NEGATIVE mg/dL
Hgb urine dipstick: NEGATIVE
Ketones, ur: NEGATIVE mg/dL
Leukocytes, UA: NEGATIVE
NITRITE: NEGATIVE
Protein, ur: NEGATIVE mg/dL
SPECIFIC GRAVITY, URINE: 1.01 (ref 1.005–1.030)
SQUAMOUS EPITHELIAL / LPF: NONE SEEN (ref 0–5)
pH: 8 (ref 5.0–8.0)

## 2018-11-06 LAB — CBC WITH DIFFERENTIAL/PLATELET
ABS IMMATURE GRANULOCYTES: 0.07 10*3/uL (ref 0.00–0.07)
BASOS PCT: 1 %
Basophils Absolute: 0.1 10*3/uL (ref 0.0–0.1)
Eosinophils Absolute: 0.3 10*3/uL (ref 0.0–0.5)
Eosinophils Relative: 3 %
HCT: 34.9 % — ABNORMAL LOW (ref 36.0–46.0)
Hemoglobin: 10.6 g/dL — ABNORMAL LOW (ref 12.0–15.0)
IMMATURE GRANULOCYTES: 1 %
LYMPHS PCT: 22 %
Lymphs Abs: 2 10*3/uL (ref 0.7–4.0)
MCH: 29.4 pg (ref 26.0–34.0)
MCHC: 30.4 g/dL (ref 30.0–36.0)
MCV: 96.7 fL (ref 80.0–100.0)
Monocytes Absolute: 0.9 10*3/uL (ref 0.1–1.0)
Monocytes Relative: 10 %
NEUTROS ABS: 5.5 10*3/uL (ref 1.7–7.7)
NEUTROS PCT: 63 %
PLATELETS: 340 10*3/uL (ref 150–400)
RBC: 3.61 MIL/uL — AB (ref 3.87–5.11)
RDW: 15.5 % (ref 11.5–15.5)
WBC: 8.7 10*3/uL (ref 4.0–10.5)
nRBC: 0 % (ref 0.0–0.2)

## 2018-11-06 LAB — SEDIMENTATION RATE: Sed Rate: 52 mm/hr — ABNORMAL HIGH (ref 0–30)

## 2018-11-07 LAB — URINE CULTURE: CULTURE: NO GROWTH

## 2019-03-21 ENCOUNTER — Other Ambulatory Visit: Payer: Self-pay

## 2019-03-21 ENCOUNTER — Emergency Department
Admission: EM | Admit: 2019-03-21 | Discharge: 2019-03-21 | Disposition: A | Discharge status: Skilled Nursing Facility | Payer: Medicare Other | Attending: Emergency Medicine | Admitting: Emergency Medicine

## 2019-03-21 DIAGNOSIS — J449 Chronic obstructive pulmonary disease, unspecified: Secondary | ICD-10-CM | POA: Diagnosis not present

## 2019-03-21 DIAGNOSIS — I10 Essential (primary) hypertension: Secondary | ICD-10-CM | POA: Insufficient documentation

## 2019-03-21 DIAGNOSIS — R4689 Other symptoms and signs involving appearance and behavior: Secondary | ICD-10-CM

## 2019-03-21 DIAGNOSIS — R4182 Altered mental status, unspecified: Secondary | ICD-10-CM | POA: Diagnosis present

## 2019-03-21 LAB — COMPREHENSIVE METABOLIC PANEL
ALBUMIN: 3.9 g/dL (ref 3.5–5.0)
ALK PHOS: 65 U/L (ref 38–126)
ALT: 11 U/L (ref 0–44)
ANION GAP: 10 (ref 5–15)
AST: 14 U/L — ABNORMAL LOW (ref 15–41)
BILIRUBIN TOTAL: 0.5 mg/dL (ref 0.3–1.2)
BUN: 18 mg/dL (ref 8–23)
CALCIUM: 9.2 mg/dL (ref 8.9–10.3)
CO2: 27 mmol/L (ref 22–32)
Chloride: 104 mmol/L (ref 98–111)
Creatinine, Ser: 0.48 mg/dL (ref 0.44–1.00)
GFR calc non Af Amer: >60 mL/min (ref >60)
Glucose, Bld: 97 mg/dL (ref 70–99)
POTASSIUM: 3.7 mmol/L (ref 3.5–5.1)
Sodium: 141 mmol/L (ref 135–145)
TOTAL PROTEIN: 6.7 g/dL (ref 6.5–8.1)

## 2019-03-21 LAB — URINALYSIS, COMPLETE (UACMP) WITH MICROSCOPIC
Bacteria, UA: NONE SEEN
Bilirubin Urine: NEGATIVE
GLUCOSE, UA: NEGATIVE mg/dL
HGB URINE DIPSTICK: NEGATIVE
Ketones, ur: NEGATIVE mg/dL
LEUKOCYTE UA: NEGATIVE
NITRITE: NEGATIVE
PH: 6 (ref 5.0–8.0)
Protein, ur: NEGATIVE mg/dL
SPECIFIC GRAVITY, URINE: 1.013 (ref 1.005–1.030)

## 2019-03-21 LAB — CBC WITH DIFFERENTIAL/PLATELET
Abs Immature Granulocytes: 0.02 10*3/uL (ref 0.00–0.07)
Basophils Absolute: 0.1 10*3/uL (ref 0.0–0.1)
Basophils Relative: 1 %
EOS ABS: 0.2 10*3/uL (ref 0.0–0.5)
Eosinophils Relative: 3 %
HEMATOCRIT: 36.4 % (ref 36.0–46.0)
Hemoglobin: 11.2 g/dL — ABNORMAL LOW (ref 12.0–15.0)
Immature Granulocytes: 0 %
LYMPHS PCT: 35 %
Lymphs Abs: 2.5 10*3/uL (ref 0.7–4.0)
MCH: 25.7 pg — AB (ref 26.0–34.0)
MCHC: 30.8 g/dL (ref 30.0–36.0)
MCV: 83.7 fL (ref 80.0–100.0)
MONO ABS: 0.7 10*3/uL (ref 0.1–1.0)
Monocytes Relative: 10 %
NRBC: 0 % (ref 0.0–0.2)
Neutro Abs: 3.6 10*3/uL (ref 1.7–7.7)
Neutrophils Relative %: 51 %
Platelets: 250 10*3/uL (ref 150–400)
RBC: 4.35 MIL/uL (ref 3.87–5.11)
RDW: 15.7 % — AB (ref 11.5–15.5)
WBC: 7.1 10*3/uL (ref 4.0–10.5)

## 2019-03-21 NOTE — ED Notes (Signed)
Pts son Billey Gosling [phone]
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
## 2019-03-23 LAB — URINE CULTURE: Culture: NO GROWTH

## 2019-05-15 ENCOUNTER — Emergency Department
Admission: EM | Admit: 2019-05-15 | Discharge: 2019-05-16 | Disposition: A | Discharge status: Skilled Nursing Facility | Payer: Medicare Other | Attending: Student in an Organized Health Care Education/Training Program | Admitting: Student in an Organized Health Care Education/Training Program

## 2019-05-15 ENCOUNTER — Encounter: Payer: Self-pay | Admitting: Emergency Medicine

## 2019-05-15 ENCOUNTER — Emergency Department: Payer: Medicare Other

## 2019-05-15 ENCOUNTER — Other Ambulatory Visit: Payer: Self-pay

## 2019-05-15 DIAGNOSIS — J449 Chronic obstructive pulmonary disease, unspecified: Secondary | ICD-10-CM | POA: Insufficient documentation

## 2019-05-15 DIAGNOSIS — F419 Anxiety disorder, unspecified: Secondary | ICD-10-CM | POA: Insufficient documentation

## 2019-05-15 DIAGNOSIS — F0390 Unspecified dementia without behavioral disturbance: Secondary | ICD-10-CM | POA: Diagnosis not present

## 2019-05-15 DIAGNOSIS — Z79899 Other long term (current) drug therapy: Secondary | ICD-10-CM | POA: Diagnosis not present

## 2019-05-15 DIAGNOSIS — Z87891 Personal history of nicotine dependence: Secondary | ICD-10-CM | POA: Insufficient documentation

## 2019-05-15 DIAGNOSIS — I10 Essential (primary) hypertension: Secondary | ICD-10-CM | POA: Diagnosis not present

## 2019-05-15 DIAGNOSIS — R079 Chest pain, unspecified: Secondary | ICD-10-CM | POA: Diagnosis present

## 2019-05-15 LAB — BASIC METABOLIC PANEL
Anion gap: 10 (ref 5–15)
BUN: 14 mg/dL (ref 8–23)
CO2: 25 mmol/L (ref 22–32)
Calcium: 9.3 mg/dL (ref 8.9–10.3)
Chloride: 107 mmol/L (ref 98–111)
Creatinine, Ser: 0.54 mg/dL (ref 0.44–1.00)
GFR calc Af Amer: >60 mL/min (ref >60)
GFR calc non Af Amer: >60 mL/min (ref >60)
Glucose, Bld: 113 mg/dL — ABNORMAL HIGH (ref 70–99)
Potassium: 3.2 mmol/L — ABNORMAL LOW (ref 3.5–5.1)
Sodium: 142 mmol/L (ref 135–145)

## 2019-05-15 LAB — CBC
HCT: 37.7 % (ref 36.0–46.0)
Hemoglobin: 11.9 g/dL — ABNORMAL LOW (ref 12.0–15.0)
MCH: 26.4 pg (ref 26.0–34.0)
MCHC: 31.6 g/dL (ref 30.0–36.0)
MCV: 83.6 fL (ref 80.0–100.0)
Platelets: 258 10*3/uL (ref 150–400)
RBC: 4.51 MIL/uL (ref 3.87–5.11)
RDW: 15.1 % (ref 11.5–15.5)
WBC: 7.9 10*3/uL (ref 4.0–10.5)
nRBC: 0 % (ref 0.0–0.2)

## 2019-05-15 LAB — TROPONIN I
Troponin I: <0.03 ng/mL (ref <0.03)
Troponin I: <0.03 ng/mL (ref <0.03)

## 2019-05-15 MED ORDER — HYDRALAZINE HCL 50 MG PO TABS
25.0000 mg | ORAL_TABLET | Freq: Once | ORAL | Status: AC
  Administered 2019-05-15: 18:00:00 25 mg via ORAL
  Filled 2019-05-15: qty 1

## 2019-05-15 MED ORDER — QUETIAPINE FUMARATE 25 MG PO TABS
25.0000 mg | ORAL_TABLET | Freq: Every day | ORAL | Status: DC
  Administered 2019-05-15: 20:00:00 25 mg via ORAL
  Filled 2019-05-15: qty 1

## 2019-05-15 MED ORDER — AMLODIPINE BESYLATE 5 MG PO TABS
5.0000 mg | ORAL_TABLET | Freq: Once | ORAL | Status: AC
  Administered 2019-05-15: 5 mg via ORAL
  Filled 2019-05-15: qty 1

## 2019-05-15 MED ORDER — QUETIAPINE FUMARATE 25 MG PO TABS: 25.0000 mg | ORAL_TABLET | Freq: Every day | ORAL | Status: DC

## 2019-05-15 MED ORDER — LORAZEPAM 0.5 MG PO TABS
0.5000 mg | ORAL_TABLET | Freq: Once | ORAL | Status: AC
  Administered 2019-05-15: 18:00:00 0.5 mg via ORAL
  Filled 2019-05-15: qty 1

## 2019-05-15 NOTE — ED Notes (Addendum)
Pt in bed with sitter, pt removed IV  Bleeding controlled, pt denies pain, oriented to self, appears calm, light dimmed

## 2019-05-15 NOTE — ED Provider Notes (Signed)
[facility] Emergency Department Provider Note    First MD Initiated Contact with Patient 05/15/19 1713     (approximate)  I have reviewed the triage vital signs and the nursing notes.   HISTORY  Chief Complaint Chest Pain  Level V Caveat:  AMS dementia  HPI Heather [name] is a 78 y.o. female presents from memory care facility for evaluation of chest discomfort.  She is currently chest pain-free.  States that she was feeling anxious having chest discomfort that she felt like she was being manipulated by staff members.  She states that she does feel anxious at times.  Denies any shortness of breath.  Does wear 3 L nasal cannula at baseline.  Denies any nausea or vomiting.  No discomfort at this time.    Past Medical History:  Diagnosis Date  . COPD (chronic obstructive pulmonary disease) (HCC)   . Hypertension   . Macular degeneration    History reviewed. No pertinent family history. History reviewed. No pertinent surgical history. Patient Active Problem List   Diagnosis Date Noted  . COPD (chronic obstructive pulmonary disease) (HCC) 03/21/2018      Prior to Admission medications   Medication Sig Start Date End Date Taking? Authorizing Provider  acetaminophen (TYLENOL) 325 MG tablet Take 650 mg by mouth every 6 (six) hours as needed.     [provider]  albuterol (PROVENTIL HFA;VENTOLIN HFA) 108 (90 Base) MCG/ACT inhaler Inhale 1 puff into the lungs every 6 (six) hours as needed for wheezing or shortness of breath.    [provider]  calcium carbonate (TUMS - DOSED IN MG ELEMENTAL CALCIUM) 500 MG chewable tablet Chew 1 tablet by mouth every 8 (eight) hours as needed for indigestion or heartburn.    [provider]  docusate sodium (COLACE) 100 MG capsule Take 100 mg by mouth daily.    [provider]  ferrous sulfate 325 (65 FE) MG EC tablet Take 325 mg by mouth every morning.    [provider]   Fluticasone-Salmeterol (ADVAIR) 250-50 MCG/DOSE AEPB Inhale 1 puff into the lungs every 12 (twelve) hours.    [provider]  lidocaine (LIDODERM) 5 % Place 1 patch onto the skin daily. Remove & Discard patch within 12 hours or as directed by MD    [provider]  loratadine (CLARITIN) 5 MG chewable tablet Chew 5 mg by mouth daily.    [provider]  Melatonin 3 MG TABS Take 3 mg by mouth at bedtime.    [provider]  metoprolol tartrate (LOPRESSOR) 25 MG tablet Take 1 tablet (25 mg total) by mouth 2 (two) times daily. 03/22/18   Salary, Evelena AsaMontell D, MD  Multiple Vitamin (MULTIVITAMIN WITH MINERALS) TABS tablet Take 1 tablet by mouth every morning.    [provider]  omeprazole (PRILOSEC) 40 MG capsule Take 40 mg by mouth every morning.    [provider]  oxyCODONE (OXY IR/ROXICODONE) 5 MG immediate release tablet Take 5 mg by mouth every 6 (six) hours as needed for severe pain.    [provider]  Polyethyl Glycol-Propyl Glycol (SYSTANE) 0.4-0.3 % SOLN Place 1 drop into both eyes daily as needed.    [provider]  polyethylene glycol (MIRALAX / GLYCOLAX) packet Take 17 g by mouth daily as needed for mild constipation.     [provider]  pregabalin (LYRICA) 150 MG capsule Take 150 mg by mouth every 12 (twelve) hours.    [provider]  QUEtiapine (SEROQUEL) 25 MG tablet Take 25 mg by mouth at bedtime.    [provider]  sertraline (ZOLOFT) 50 MG tablet Take 50 mg by mouth daily.    [provider]    Allergies Patient has no known allergies.    Social History Social History   Tobacco Use  . Smoking status: Former Games developer  . Smokeless tobacco: Never Used  Substance Use Topics  . Alcohol use: Not Currently  . Drug use: Not Currently    Review of Systems Patient denies headaches, rhinorrhea, blurry vision, numbness, shortness of breath, chest pain, edema, cough, abdominal  pain, nausea, vomiting, diarrhea, dysuria, fevers, rashes or hallucinations unless otherwise stated above in HPI. ____________________________________________   PHYSICAL EXAM:  VITAL SIGNS: Vitals:   05/15/19 1900 05/15/19 2030  BP: (!) 195/100 (!) 148/77  Pulse: 75 73  Resp: 19 (!) 23  Temp:    SpO2: 99% 98%    Constitutional: Alert, anxious appearing, in NAD Eyes: Conjunctivae are normal.  Head: Atraumatic. Nose: No congestion/rhinnorhea. Mouth/Throat: Mucous membranes are moist.   Neck: No stridor. Painless ROM.  Cardiovascular: Normal rate, regular rhythm. Grossly normal heart sounds.  Good peripheral circulation. Respiratory: Normal respiratory effort.  No retractions. Lungs CTAB. Gastrointestinal: Soft and nontender. No distention. No abdominal bruits. No CVA tenderness. Genitourinary:  Musculoskeletal: No lower extremity tenderness nor edema.  No joint effusions. Neurologic:  Normal speech and language. No gross focal neurologic deficits are appreciated. No facial droop Skin:  Skin is warm, dry and intact. No rash noted. Psychiatric: Mood and affect are anxious. Speech and behavior are normal.  ____________________________________________   LABS (all labs ordered are listed, but only abnormal results are displayed)  Results for orders placed or performed during the hospital encounter of 05/15/19 (from the past 24 hour(s))  Basic metabolic panel     Status: Abnormal   Collection Time: 05/15/19  5:17 PM  Result Value Ref Range   Sodium 142 135 - 145 mmol/L   Potassium 3.2 (L) 3.5 - 5.1 mmol/L   Chloride 107 98 - 111 mmol/L   CO2 25 22 - 32 mmol/L   Glucose, Bld 113 (H) 70 - 99 mg/dL   BUN 14 8 - 23 mg/dL   Creatinine, Ser 4.09 0.44 - 1.00 mg/dL   Calcium 9.3 8.9 - 81.1 mg/dL   GFR calc non Af Amer >60 >60 mL/min   GFR calc Af Amer >60 >60 mL/min   Anion gap 10 5 - 15  CBC     Status: Abnormal   Collection Time: 05/15/19  5:17 PM  Result Value Ref Range   WBC  7.9 4.0 - 10.5 K/uL   RBC 4.51 3.87 - 5.11 MIL/uL   Hemoglobin 11.9 (L) 12.0 - 15.0 g/dL   HCT 91.4 78.2 - 95.6 %   MCV 83.6 80.0 - 100.0 fL   MCH 26.4 26.0 - 34.0 pg   MCHC 31.6 30.0 - 36.0 g/dL   RDW 21.3 08.6 - 57.8 %   Platelets 258 150 - 400 K/uL   nRBC 0.0 0.0 - 0.2 %  Troponin I - ONCE - STAT     Status: None   Collection Time: 05/15/19  5:17 PM  Result Value Ref Range   Troponin I <0.03 <0.03 ng/mL  Troponin I - ONCE - STAT     Status: None   Collection Time: 05/15/19  8:31 PM  Result Value Ref Range   Troponin I <0.03 <0.03 ng/mL  ____________________________________________  EKG My review and personal interpretation at Time: 17:11   Indication: htn  Rate: 75  Rhythm: sinus Axis: normal Other: normal intervals, nostemi ____________________________________________  RADIOLOGY  I personally reviewed all radiographic images ordered to evaluate for the above acute complaints and reviewed radiology reports and findings.  These findings were personally discussed with the patient.  Please see medical record for radiology report.  ____________________________________________   PROCEDURES  Procedure(s) performed:  Procedures    Critical Care performed: no ____________________________________________   INITIAL IMPRESSION / ASSESSMENT AND PLAN / ED COURSE  Pertinent labs & imaging results that were available during my care of the patient were reviewed by me and considered in my medical decision making (see chart for details).   DDX: htnive urgency, acs, chf, medication withdrawal, anxiety  Heather [name] is a 78 y.o. who presents to the ED with symptoms as described above she is afebrile hypertensive in no respiratory distress.  EKG shows no acute ischemic changes.  Have a high suspicion for anxiety induced hypertension.  Patient calm and pleasant at this time but does feel anxious.  The patient will be placed on continuous pulse oximetry and telemetry for monitoring.   Laboratory evaluation will be sent to evaluate for the above complaints.     Clinical Course as of May 15 2107  Fri May 15, 2019  5701 Patient states that she feels anxious and is requesting something for her nerves.  We will give low-dose Ativan and reassess.   [PR]  65 Gust case with patient's son and power of attorney.  States that over the past week the facility has stopped many of her medications they are worried that she is becoming oversedated however she is becoming more anxious since then.  Will give additional blood pressure medication as well as anxiolytic here.  Will repeat troponin ensure she is not having any sort of hypertensive urgency.  She is currently pain-free.   [PR]  2020 GFR, Est Non African American: >60 [PR]  2105 Pressure improved after restarting her prescribed Seroquel.  Patient comfortable denies any chest pain or discomfort.  Repeat troponin negative and stable.  Patient appropriate for outpatient follow-up.   [PR]    Clinical Course User Index [PR] Willy Eddy, MD    The patient was evaluated in Emergency Department today for the symptoms described in the history of present illness. He/she was evaluated in the context of the global COVID-19 pandemic, which necessitated consideration that the patient might be at risk for infection with the SARS-CoV-2 virus that causes COVID-19. Institutional protocols and algorithms that pertain to the evaluation of patients at risk for COVID-19 are in a state of rapid change based on information released by regulatory bodies including the CDC and federal and state organizations. These policies and algorithms were followed during the patient's care in the ED.   As part of my medical decision making, I reviewed the following data within the electronic MEDICAL RECORD NUMBER Nursing notes reviewed and incorporated, Labs reviewed, notes from prior ED visits and [place] Controlled Substance Database    ____________________________________________   FINAL CLINICAL IMPRESSION(S) / ED DIAGNOSES  Final diagnoses:  Anxiousness  Hypertension, unspecified type      NEW MEDICATIONS STARTED DURING THIS VISIT:  New Prescriptions   No medications on file     Note:  This document was prepared using Dragon voice recognition software and may include unintentional dictation errors.    Willy Eddy, MD 05/15/19 2109

## 2019-05-15 NOTE — ED Notes (Signed)
Pt brief and linen changed by this tech and EDT Alissa, this tech noticed a strong urine smell on pt, MD and Rn made aware

## 2019-05-15 NOTE — ED Notes (Signed)
Fall alarm under pt. Checked and is working
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
## 2019-05-15 NOTE — ED Notes (Signed)
This tech in to sit with pt for safety reasons  

## 2019-05-15 NOTE — Discharge Instructions (Signed)
I would recommend restarting your previously prescribed Seroquel and discussing with your PCP need for additional blood pressure medication.  Return to the ER for any additional questions or concerns.
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
## 2019-05-21 ENCOUNTER — Emergency Department: Payer: Medicare Other

## 2019-05-21 ENCOUNTER — Emergency Department
Admission: EM | Admit: 2019-05-21 | Discharge: 2019-05-21 | Disposition: A | Discharge status: Home / Self Care | Payer: Medicare Other | Attending: Emergency Medicine | Admitting: Emergency Medicine

## 2019-05-21 ENCOUNTER — Encounter: Payer: Self-pay | Admitting: Emergency Medicine

## 2019-05-21 ENCOUNTER — Other Ambulatory Visit: Payer: Self-pay

## 2019-05-21 DIAGNOSIS — I10 Essential (primary) hypertension: Secondary | ICD-10-CM | POA: Diagnosis not present

## 2019-05-21 DIAGNOSIS — Z87891 Personal history of nicotine dependence: Secondary | ICD-10-CM | POA: Diagnosis not present

## 2019-05-21 DIAGNOSIS — R112 Nausea with vomiting, unspecified: Secondary | ICD-10-CM | POA: Diagnosis present

## 2019-05-21 DIAGNOSIS — J449 Chronic obstructive pulmonary disease, unspecified: Secondary | ICD-10-CM | POA: Insufficient documentation

## 2019-05-21 DIAGNOSIS — Z20828 Contact with and (suspected) exposure to other viral communicable diseases: Secondary | ICD-10-CM | POA: Insufficient documentation

## 2019-05-21 DIAGNOSIS — Z79899 Other long term (current) drug therapy: Secondary | ICD-10-CM | POA: Insufficient documentation

## 2019-05-21 LAB — CBC WITH DIFFERENTIAL/PLATELET
Abs Immature Granulocytes: 0.02 10*3/uL (ref 0.00–0.07)
Basophils Absolute: 0.1 10*3/uL (ref 0.0–0.1)
Basophils Relative: 1 %
Eosinophils Absolute: 0.2 10*3/uL (ref 0.0–0.5)
Eosinophils Relative: 2 %
HCT: 39.6 % (ref 36.0–46.0)
Hemoglobin: 12.6 g/dL (ref 12.0–15.0)
Immature Granulocytes: 0 %
Lymphocytes Relative: 23 %
Lymphs Abs: 2 10*3/uL (ref 0.7–4.0)
MCH: 26.9 pg (ref 26.0–34.0)
MCHC: 31.8 g/dL (ref 30.0–36.0)
MCV: 84.6 fL (ref 80.0–100.0)
Monocytes Absolute: 0.7 10*3/uL (ref 0.1–1.0)
Monocytes Relative: 9 %
Neutro Abs: 5.6 10*3/uL (ref 1.7–7.7)
Neutrophils Relative %: 65 %
Platelets: 262 10*3/uL (ref 150–400)
RBC: 4.68 MIL/uL (ref 3.87–5.11)
RDW: 15.4 % (ref 11.5–15.5)
WBC: 8.7 10*3/uL (ref 4.0–10.5)
nRBC: 0 % (ref 0.0–0.2)

## 2019-05-21 LAB — COMPREHENSIVE METABOLIC PANEL
ALT: 16 U/L (ref 0–44)
AST: 16 U/L (ref 15–41)
Albumin: 3.9 g/dL (ref 3.5–5.0)
Alkaline Phosphatase: 88 U/L (ref 38–126)
Anion gap: 9 (ref 5–15)
BUN: 15 mg/dL (ref 8–23)
CO2: 30 mmol/L (ref 22–32)
Calcium: 9.3 mg/dL (ref 8.9–10.3)
Chloride: 104 mmol/L (ref 98–111)
Creatinine, Ser: 0.55 mg/dL (ref 0.44–1.00)
GFR calc Af Amer: >60 mL/min (ref >60)
GFR calc non Af Amer: >60 mL/min (ref >60)
Glucose, Bld: 112 mg/dL — ABNORMAL HIGH (ref 70–99)
Potassium: 3 mmol/L — ABNORMAL LOW (ref 3.5–5.1)
Sodium: 143 mmol/L (ref 135–145)
Total Bilirubin: 0.6 mg/dL (ref 0.3–1.2)
Total Protein: 7.2 g/dL (ref 6.5–8.1)

## 2019-05-21 LAB — TROPONIN I: Troponin I: <0.03 ng/mL (ref <0.03)

## 2019-05-21 LAB — SARS CORONAVIRUS 2 BY RT PCR (HOSPITAL ORDER, PERFORMED IN ~~LOC~~ HOSPITAL LAB): SARS Coronavirus 2: NEGATIVE

## 2019-05-21 NOTE — ED Notes (Signed)
Patient discharged via EMS, good condition, facility called to notify of transport.

## 2019-05-21 NOTE — ED Notes (Signed)
Pt gives consent for this RN to call her son.

## 2019-05-21 NOTE — ED Provider Notes (Signed)
[facility] Emergency Department Provider Note   ____________________________________________    I have reviewed the triage vital signs and the nursing notes.   HISTORY  Chief Complaint Emesis     HPI Heather [name] is a 78 y.o. female who presents after an episode of nausea and vomiting which occurred earlier today.  Patient states that "I may have gotten hysterical ".  She reports a lot has been going on recently that is causing her stress including the death of her younger brother recently.  She says it has been a lot to handle and she got very agitated today which she thinks made her vomit.  Nursing home staff gave her an Ativan which seemed to calm her down.  Currently she is feeling better, denies abdominal pain, no chest pain, no current nausea or vomiting.  No SI or HI   Past Medical History:  Diagnosis Date  . COPD (chronic obstructive pulmonary disease) (HCC)   . Hypertension   . Macular degeneration     Patient Active Problem List   Diagnosis Date Noted  . COPD (chronic obstructive pulmonary disease) (HCC) 03/21/2018    History reviewed. No pertinent surgical history.  Prior to Admission medications   Medication Sig Start Date End Date Taking? Authorizing Provider  acetaminophen (TYLENOL) 325 MG tablet Take 650 mg by mouth every 6 (six) hours as needed.     [provider]  albuterol (PROVENTIL HFA;VENTOLIN HFA) 108 (90 Base) MCG/ACT inhaler Inhale 1 puff into the lungs every 6 (six) hours as needed for wheezing or shortness of breath.    [provider]  calcium carbonate (TUMS - DOSED IN MG ELEMENTAL CALCIUM) 500 MG chewable tablet Chew 1 tablet by mouth every 8 (eight) hours as needed for indigestion or heartburn.    [provider]  docusate sodium (COLACE) 100 MG capsule Take 100 mg by mouth daily.    [provider]  ferrous sulfate 325 (65 FE) MG EC tablet Take 325 mg by mouth every morning.     [provider]  Fluticasone-Salmeterol (ADVAIR) 250-50 MCG/DOSE AEPB Inhale 1 puff into the lungs every 12 (twelve) hours.    [provider]  lidocaine (LIDODERM) 5 % Place 1 patch onto the skin daily. Remove & Discard patch within 12 hours or as directed by MD    [provider]  loratadine (CLARITIN) 5 MG chewable tablet Chew 5 mg by mouth daily.    [provider]  Melatonin 3 MG TABS Take 3 mg by mouth at bedtime.    [provider]  metoprolol tartrate (LOPRESSOR) 25 MG tablet Take 1 tablet (25 mg total) by mouth 2 (two) times daily. 03/22/18   Salary, Evelena AsaMontell D, MD  Multiple Vitamin (MULTIVITAMIN WITH MINERALS) TABS tablet Take 1 tablet by mouth every morning.    [provider]  omeprazole (PRILOSEC) 40 MG capsule Take 40 mg by mouth every morning.    [provider]  oxyCODONE (OXY IR/ROXICODONE) 5 MG immediate release tablet Take 5 mg by mouth every 6 (six) hours as needed for severe pain.    [provider]  Polyethyl Glycol-Propyl Glycol (SYSTANE) 0.4-0.3 % SOLN Place 1 drop into both eyes daily as needed.    [provider]  polyethylene glycol (MIRALAX / GLYCOLAX) packet Take 17 g by mouth daily as needed for mild constipation.     [provider]  pregabalin (LYRICA) 150 MG capsule Take 150 mg by mouth  every 12 (twelve) hours.    [provider]  QUEtiapine (SEROQUEL) 25 MG tablet Take 25 mg by mouth at bedtime.    [provider]  sertraline (ZOLOFT) 50 MG tablet Take 50 mg by mouth daily.    [provider]     Allergies Patient has no known allergies.  No family history on file.  Social History Social History   Tobacco Use  . Smoking status: Former Games developer  . Smokeless tobacco: Never Used  Substance Use Topics  . Alcohol use: Not Currently  . Drug use: Not Currently    Review of Systems  Constitutional: No fever/chills Eyes: No visual changes.  ENT:  No sore throat. Cardiovascular: Denies chest pain. Respiratory: Denies shortness of breath. Gastrointestinal: As above Genitourinary: Negative for dysuria. Musculoskeletal: Negative for back pain. Skin: Negative for rash. Neurological: Negative for headaches or weakness   ____________________________________________   PHYSICAL EXAM:  VITAL SIGNS: ED Triage Vitals  Enc Vitals Group     BP 05/21/19 1637 (!) 180/90     Pulse Rate 05/21/19 1637 74     Resp 05/21/19 1637 20     Temp 05/21/19 1637 97.8 F (36.6 C)     Temp Source 05/21/19 1637 Oral     SpO2 05/21/19 1637 99 %     Weight 05/21/19 1638 68 kg (150 lb)     Height 05/21/19 1638 1.651 m ( )     Head Circumference --      Peak Flow --      Pain Score 05/21/19 1638 0     Pain Loc --      Pain Edu? --      Excl. in [place]? --     Constitutional: Alert and oriented.     Nose: No congestion/rhinnorhea. Mouth/Throat: Mucous membranes are moist.    Cardiovascular: Normal rate, regular rhythm. Grossly normal heart sounds.  Good peripheral circulation. Respiratory: Normal respiratory effort.  No retractions. Lungs CTAB. Gastrointestinal: Soft and nontender. No distention.  No CVA tenderness.  Musculoskeletal: No lower extremity tenderness nor edema.  Warm and well perfused Neurologic:  Normal speech and language. No gross focal neurologic deficits are appreciated.  Skin:  Skin is warm, dry and intact. No rash noted. Psychiatric: Mood and affect are normal. Speech and behavior are normal.  ____________________________________________   LABS (all labs ordered are listed, but only abnormal results are displayed)  Labs Reviewed  COMPREHENSIVE METABOLIC PANEL - Abnormal; Notable for the following components:      Result Value   Potassium 3.0 (*)    Glucose, Bld 112 (*)    All other components within normal limits  SARS CORONAVIRUS 2 (HOSPITAL ORDER, PERFORMED IN [facility])  CBC WITH  DIFFERENTIAL/PLATELET  TROPONIN I   ____________________________________________  EKG ED ECG REPORT I, Jene Every, the attending physician, personally viewed and interpreted this ECG.  Date: 05/21/2019  Rhythm: normal sinus rhythm QRS Axis: normal Intervals: normal ST/T Wave abnormalities: normal Narrative Interpretation: no evidence of acute ischemia  ____________________________________________  RADIOLOGY  Chest x-ray unremarkable ____________________________________________   PROCEDURES  Procedure(s) performed: No  Procedures   Critical Care performed: No ____________________________________________   INITIAL IMPRESSION / ASSESSMENT AND PLAN / ED COURSE  Pertinent labs & imaging results that were available during my care of the patient were reviewed by me and considered in my medical decision making (see chart for details).  Patient is doing well in the emergency department, no abdominal pain, no further  nausea or vomiting.  Lab work here is quite reassuring, coronavirus test is negative.  Chest x-ray unremarkable, EKG is reassuring.  Does appear that anxiety/agitation caused her nausea today.  No indication for admission at this time, she is quite comfortable returning to her facility, return precautions discussed    ____________________________________________   FINAL CLINICAL IMPRESSION(S) / ED DIAGNOSES  Final diagnoses:  Nausea and vomiting, intractability of vomiting not specified, unspecified vomiting type        Note:  This document was prepared using Dragon voice recognition software and may include unintentional dictation errors.   Jene Every, MD 05/21/19 2022

## 2019-05-21 NOTE — Discharge Instructions (Signed)
Patient's work-up is quite reassuring, she has not had any vomiting here in the emergency department, lab work is unremarkable, additionally she had a coronavirus test which was negative
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
## 2019-05-21 NOTE — ED Notes (Signed)
This RN called and spoke to [place] from [facility]. Per Misty Stanley from [facility] pt c/o sore throat, body aches, decreased lung sounds, decreased bowel sounds, and 1 episode of vomiting around 1400. Per Misty Stanley from [place] Ativan given around 1520 and recent medication changes.

## 2019-05-21 NOTE — ED Notes (Signed)
Pt's son Vinetta Bergamo called and updated of pending discharge back to facility.

## 2019-08-09 ENCOUNTER — Emergency Department
Admission: EM | Admit: 2019-08-09 | Discharge: 2019-08-09 | Disposition: A | Discharge status: Home / Self Care | Payer: Medicare Other | Attending: Emergency Medicine | Admitting: Emergency Medicine

## 2019-08-09 ENCOUNTER — Encounter: Payer: Self-pay | Admitting: Emergency Medicine

## 2019-08-09 ENCOUNTER — Other Ambulatory Visit: Payer: Self-pay

## 2019-08-09 DIAGNOSIS — Y9389 Activity, other specified: Secondary | ICD-10-CM | POA: Diagnosis not present

## 2019-08-09 DIAGNOSIS — J449 Chronic obstructive pulmonary disease, unspecified: Secondary | ICD-10-CM | POA: Diagnosis not present

## 2019-08-09 DIAGNOSIS — Z79899 Other long term (current) drug therapy: Secondary | ICD-10-CM | POA: Diagnosis not present

## 2019-08-09 DIAGNOSIS — W503XXA Accidental bite by another person, initial encounter: Secondary | ICD-10-CM | POA: Insufficient documentation

## 2019-08-09 DIAGNOSIS — Y92129 Unspecified place in nursing home as the place of occurrence of the external cause: Secondary | ICD-10-CM | POA: Diagnosis not present

## 2019-08-09 DIAGNOSIS — Y999 Unspecified external cause status: Secondary | ICD-10-CM | POA: Insufficient documentation

## 2019-08-09 DIAGNOSIS — I10 Essential (primary) hypertension: Secondary | ICD-10-CM | POA: Insufficient documentation

## 2019-08-09 DIAGNOSIS — Z23 Encounter for immunization: Secondary | ICD-10-CM | POA: Insufficient documentation

## 2019-08-09 DIAGNOSIS — Z87891 Personal history of nicotine dependence: Secondary | ICD-10-CM | POA: Insufficient documentation

## 2019-08-09 DIAGNOSIS — S51811A Laceration without foreign body of right forearm, initial encounter: Secondary | ICD-10-CM | POA: Diagnosis present

## 2019-08-09 MED ORDER — AMOXICILLIN-POT CLAVULANATE 875-125 MG PO TABS: 1.0000 | ORAL_TABLET | Freq: Two times a day (BID) | ORAL | 0 refills | Status: AC

## 2019-08-09 MED ORDER — TETANUS-DIPHTH-ACELL PERTUSSIS 5-2.5-18.5 LF-MCG/0.5 IM SUSP
0.5000 mL | Freq: Once | INTRAMUSCULAR | Status: AC
  Administered 2019-08-09: 0.5 mL via INTRAMUSCULAR
  Filled 2019-08-09: qty 0.5
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
## 2019-08-09 NOTE — ED Notes (Signed)
Attempted to call report to [facility], no answer.

## 2019-08-09 NOTE — ED Notes (Signed)
Wound cleansed and dressed with gauze 4x4.
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
## 2019-08-09 NOTE — ED Notes (Signed)
Pt's son Evlyn Clines updated on patient's status.

## 2019-08-09 NOTE — ED Notes (Signed)
Wound dressed with Xeroform and 4x4 gauze, wrapped with kerlex.

## 2019-08-09 NOTE — ED Notes (Signed)
ED Provider at bedside. 

## 2019-08-28 ENCOUNTER — Emergency Department: Payer: Medicare Other

## 2019-08-28 ENCOUNTER — Encounter: Payer: Self-pay | Admitting: *Deleted

## 2019-08-28 ENCOUNTER — Emergency Department
Admission: EM | Admit: 2019-08-28 | Discharge: 2019-08-28 | Disposition: A | Discharge status: Home / Self Care | Payer: Medicare Other | Attending: Emergency Medicine | Admitting: Emergency Medicine

## 2019-08-28 ENCOUNTER — Other Ambulatory Visit: Payer: Self-pay

## 2019-08-28 DIAGNOSIS — J449 Chronic obstructive pulmonary disease, unspecified: Secondary | ICD-10-CM | POA: Diagnosis not present

## 2019-08-28 DIAGNOSIS — Z87891 Personal history of nicotine dependence: Secondary | ICD-10-CM | POA: Diagnosis not present

## 2019-08-28 DIAGNOSIS — W010XXA Fall on same level from slipping, tripping and stumbling without subsequent striking against object, initial encounter: Secondary | ICD-10-CM | POA: Diagnosis not present

## 2019-08-28 DIAGNOSIS — Y999 Unspecified external cause status: Secondary | ICD-10-CM | POA: Diagnosis not present

## 2019-08-28 DIAGNOSIS — F0390 Unspecified dementia without behavioral disturbance: Secondary | ICD-10-CM | POA: Insufficient documentation

## 2019-08-28 DIAGNOSIS — Z96642 Presence of left artificial hip joint: Secondary | ICD-10-CM | POA: Insufficient documentation

## 2019-08-28 DIAGNOSIS — I10 Essential (primary) hypertension: Secondary | ICD-10-CM | POA: Diagnosis not present

## 2019-08-28 DIAGNOSIS — S299XXA Unspecified injury of thorax, initial encounter: Secondary | ICD-10-CM | POA: Diagnosis present

## 2019-08-28 DIAGNOSIS — W19XXXA Unspecified fall, initial encounter: Secondary | ICD-10-CM

## 2019-08-28 DIAGNOSIS — Y9389 Activity, other specified: Secondary | ICD-10-CM | POA: Diagnosis not present

## 2019-08-28 DIAGNOSIS — S22030A Wedge compression fracture of third thoracic vertebra, initial encounter for closed fracture: Secondary | ICD-10-CM | POA: Insufficient documentation

## 2019-08-28 DIAGNOSIS — Y92129 Unspecified place in nursing home as the place of occurrence of the external cause: Secondary | ICD-10-CM | POA: Diagnosis not present

## 2019-08-28 DIAGNOSIS — Z79899 Other long term (current) drug therapy: Secondary | ICD-10-CM | POA: Insufficient documentation

## 2019-08-28 NOTE — ED Notes (Signed)
EMS has arrived to transport pt. Pt in NAD at time of discharge.

## 2019-08-28 NOTE — ED Notes (Signed)
Patient transported to CT 

## 2019-08-28 NOTE — ED Notes (Signed)
Larene Beach, RN called pts son, Evlyn Clines who confirmed pt will need to be sent back to [facility] via Ambulance. Son reports it is hard for pt to transfer from wheelchair to car and car to wheelchair.

## 2019-08-28 NOTE — ED Triage Notes (Addendum)
Pt arrived via [facility] from [place] post fall while picking up trash. Pt denies injury or pain. Pt denies dizziness or LOC. Pt has hx of dementia and is altered at baseline, but per EMS and [facility] staff pt is acting appropriate per her baseline.

## 2019-08-28 NOTE — ED Notes (Signed)
Larene Beach, RN called to give discharge report to [facility]   Myriam Jacobson from [place] confirms they do not have transportation at this time. Pt will have to be transported by EMS if family is not available. RN will attempt to call family.

## 2019-08-28 NOTE — ED Provider Notes (Signed)
[facility] Emergency Department Provider Note   ____________________________________________   First MD Initiated Contact with Patient 08/28/19 1643     (approximate)  I have reviewed the triage vital signs and the nursing notes.   HISTORY  Chief Complaint Fall    HPI Heather [name] is a 78 y.o. female with past medical history of dementia, COPD, hypertension who presents to the ED following fall.  History is limited secondary to patient's dementia.  Patient resides at [facility] assisted living and reportedly had a fall while attempting to pick up trash.  When asked if she fell, patient states "that is alive".  She denies any symptoms at this time, including headache, neck pain, chest pain, abdominal pain, hip pain, or extremity pain.  She is not anticoagulated and is at her baseline mental status per staff.        Past Medical History:  Diagnosis Date  . COPD (chronic obstructive pulmonary disease) (HCC)   . Hypertension   . Macular degeneration     Patient Active Problem List   Diagnosis Date Noted  . COPD (chronic obstructive pulmonary disease) (HCC) 03/21/2018    Past Surgical History:  Procedure Laterality Date  . JOINT REPLACEMENT     Lt hip replacement    Prior to Admission medications   Medication Sig Start Date End Date Taking? Authorizing Provider  acetaminophen (TYLENOL) 325 MG tablet Take 650 mg by mouth every 6 (six) hours as needed.     [provider]  albuterol (PROVENTIL HFA;VENTOLIN HFA) 108 (90 Base) MCG/ACT inhaler Inhale 1 puff into the lungs every 6 (six) hours as needed for wheezing or shortness of breath.    [provider]  calcium carbonate (TUMS - DOSED IN MG ELEMENTAL CALCIUM) 500 MG chewable tablet Chew 1 tablet by mouth every 8 (eight) hours as needed for indigestion or heartburn.    [provider]  docusate sodium (COLACE) 100 MG capsule Take 100 mg by mouth daily.    [provider]  ferrous sulfate 325 (65 FE) MG EC tablet Take 325 mg by mouth every morning.    [provider]  Fluticasone-Salmeterol (ADVAIR) 250-50 MCG/DOSE AEPB Inhale 1 puff into the lungs every 12 (twelve) hours.    [provider]  lidocaine (LIDODERM) 5 % Place 1 patch onto the skin daily. Remove & Discard patch within 12 hours or as directed by MD    [provider]  loratadine (CLARITIN) 5 MG chewable tablet Chew 5 mg by mouth daily.    [provider]  Melatonin 3 MG TABS Take 3 mg by mouth at bedtime.    [provider]  metoprolol tartrate (LOPRESSOR) 25 MG tablet Take 1 tablet (25 mg total) by mouth 2 (two) times daily. 03/22/18   Salary, Evelena Asa, MD  Multiple Vitamin (MULTIVITAMIN WITH MINERALS) TABS tablet Take 1 tablet by mouth every morning.    [provider]  omeprazole (PRILOSEC) 40 MG capsule Take 40 mg by mouth every morning.    [provider]  oxyCODONE (OXY IR/ROXICODONE) 5 MG immediate release tablet Take 5 mg by mouth every 6 (six) hours as needed for severe pain.    [provider]  Polyethyl Glycol-Propyl Glycol (SYSTANE) 0.4-0.3 % SOLN Place 1 drop into both eyes daily as needed.    [provider]  polyethylene glycol (MIRALAX / GLYCOLAX) packet Take 17 g by mouth daily as needed for mild constipation.     [provider]  pregabalin (LYRICA) 150 MG capsule Take 150 mg by mouth every 12 (twelve) hours.    [provider]  QUEtiapine (SEROQUEL) 25 MG tablet Take 25 mg by mouth at bedtime.    [provider]  sertraline (ZOLOFT) 50 MG tablet Take 50 mg by mouth daily.    [provider]    Allergies Patient has no known allergies.  History reviewed. No pertinent family history.  Social History Social History   Tobacco Use  . Smoking status: Former Research scientist (life sciences)  . Smokeless tobacco: Never Used  Substance Use Topics  . Alcohol use: Not Currently  .  Drug use: Not Currently    Review of Systems  Constitutional: No fever/chills Eyes: No visual changes. ENT: No sore throat. Cardiovascular: Denies chest pain. Respiratory: Denies shortness of breath. Gastrointestinal: No abdominal pain.  No nausea, no vomiting.  No diarrhea.  No constipation. Genitourinary: Negative for dysuria. Musculoskeletal: Negative for back pain.  Positive for fall. Skin: Negative for rash. Neurological: Negative for headaches, focal weakness or numbness.  ____________________________________________   PHYSICAL EXAM:  VITAL SIGNS: ED Triage Vitals  Enc Vitals Group     BP 08/28/19 1641 (!) 173/84     Pulse Rate 08/28/19 1641 71     Resp 08/28/19 1641 17     Temp 08/28/19 1641 97.8 F (36.6 C)     Temp Source 08/28/19 1641 Oral     SpO2 08/28/19 1641 94 %     Weight 08/28/19 1642 147 lb 11.3 oz (67 kg)     Height --      Head Circumference --      Peak Flow --      Pain Score 08/28/19 1642 0     Pain Loc --      Pain Edu? --      Excl. in [place]? --     Constitutional: Alert and oriented. Eyes: Conjunctivae are normal. Head: Atraumatic. Nose: No congestion/rhinnorhea. Mouth/Throat: Mucous membranes are moist. Neck: Normal ROM Cardiovascular: Normal rate, regular rhythm. Grossly normal heart sounds. Respiratory: Normal respiratory effort.  No retractions. Lungs CTAB. Gastrointestinal: Soft and nontender. No distention. Genitourinary: deferred Musculoskeletal: No lower extremity tenderness nor edema. Neurologic:  Normal speech and language. No gross focal neurologic deficits are appreciated. Skin:  Skin is warm, dry and intact. No rash noted.  Healing skin tear to right forearm. Psychiatric: Mood and affect are normal. Speech and behavior are normal.  ____________________________________________   LABS (all labs ordered are listed, but only abnormal results are displayed)  Labs Reviewed - No data to display   PROCEDURES  Procedure(s)  performed (including Critical Care):  Procedures   ____________________________________________   INITIAL IMPRESSION / ASSESSMENT AND PLAN / ED COURSE       78 year old female presents to the ED after reported fall at assisted living facility.  She is at her baseline mental status with no focal deficits on exam.  No obvious signs of trauma on exam.  Given her advanced age and dementia, will screen CT head and C-spine.  Fall appears mechanical, no reason to suspect syncope or another emergent etiology of fall.  CT head negative for acute process.  CT of cervical spine shows no acute cervical pathology, but does show compression deformity of T3.  This is possibly acute per radiology, however patient has no back pain or tenderness on exam to suggest acute compression fracture.  Given minimal height loss, will have patient follow-up with her PCP for this  problem.  Given patient's dementia, nursing staff has discussed this [facility].      ____________________________________________   FINAL CLINICAL IMPRESSION(S) / ED DIAGNOSES  Final diagnoses:  Fall, initial encounter  Closed wedge compression fracture of third thoracic vertebra, initial encounter [facility](HCC)     ED Discharge Orders    None       Note:  This document was prepared using Dragon voice recognition software and may include unintentional dictation errors.   Chesley NoonJessup, Lorance Pickeral, MD 08/28/19 [phone]

## 2020-10-23 ENCOUNTER — Emergency Department: Payer: Medicare Other

## 2020-10-23 ENCOUNTER — Emergency Department
Admission: EM | Admit: 2020-10-23 | Discharge: 2020-10-23 | Disposition: A | Discharge status: Home / Self Care | Payer: Medicare Other | Attending: Emergency Medicine | Admitting: Emergency Medicine

## 2020-10-23 ENCOUNTER — Other Ambulatory Visit: Payer: Self-pay

## 2020-10-23 DIAGNOSIS — Z20822 Contact with and (suspected) exposure to covid-19: Secondary | ICD-10-CM | POA: Diagnosis not present

## 2020-10-23 DIAGNOSIS — Z96642 Presence of left artificial hip joint: Secondary | ICD-10-CM | POA: Diagnosis not present

## 2020-10-23 DIAGNOSIS — I10 Essential (primary) hypertension: Secondary | ICD-10-CM | POA: Insufficient documentation

## 2020-10-23 DIAGNOSIS — W06XXXA Fall from bed, initial encounter: Secondary | ICD-10-CM | POA: Insufficient documentation

## 2020-10-23 DIAGNOSIS — F0390 Unspecified dementia without behavioral disturbance: Secondary | ICD-10-CM | POA: Insufficient documentation

## 2020-10-23 DIAGNOSIS — M25511 Pain in right shoulder: Secondary | ICD-10-CM | POA: Insufficient documentation

## 2020-10-23 DIAGNOSIS — Z87891 Personal history of nicotine dependence: Secondary | ICD-10-CM | POA: Insufficient documentation

## 2020-10-23 DIAGNOSIS — S81011A Laceration without foreign body, right knee, initial encounter: Secondary | ICD-10-CM | POA: Diagnosis not present

## 2020-10-23 DIAGNOSIS — Z79899 Other long term (current) drug therapy: Secondary | ICD-10-CM | POA: Insufficient documentation

## 2020-10-23 DIAGNOSIS — J449 Chronic obstructive pulmonary disease, unspecified: Secondary | ICD-10-CM | POA: Insufficient documentation

## 2020-10-23 DIAGNOSIS — S8991XA Unspecified injury of right lower leg, initial encounter: Secondary | ICD-10-CM | POA: Diagnosis present

## 2020-10-23 DIAGNOSIS — W19XXXA Unspecified fall, initial encounter: Secondary | ICD-10-CM

## 2020-10-23 DIAGNOSIS — Y92129 Unspecified place in nursing home as the place of occurrence of the external cause: Secondary | ICD-10-CM | POA: Diagnosis not present

## 2020-10-23 LAB — RESPIRATORY PANEL BY RT PCR (FLU A&B, COVID)
Influenza A by PCR: NEGATIVE
Influenza B by PCR: NEGATIVE
SARS Coronavirus 2 by RT PCR: NEGATIVE

## 2020-10-23 LAB — CBC WITH DIFFERENTIAL/PLATELET
Abs Immature Granulocytes: 0.02 10*3/uL (ref 0.00–0.07)
Basophils Absolute: 0.1 10*3/uL (ref 0.0–0.1)
Basophils Relative: 1 %
Eosinophils Absolute: 0.2 10*3/uL (ref 0.0–0.5)
Eosinophils Relative: 3 %
HCT: 37.2 % (ref 36.0–46.0)
Hemoglobin: 11.6 g/dL — ABNORMAL LOW (ref 12.0–15.0)
Immature Granulocytes: 0 %
Lymphocytes Relative: 20 %
Lymphs Abs: 1.6 10*3/uL (ref 0.7–4.0)
MCH: 28 pg (ref 26.0–34.0)
MCHC: 31.2 g/dL (ref 30.0–36.0)
MCV: 89.6 fL (ref 80.0–100.0)
Monocytes Absolute: 0.8 10*3/uL (ref 0.1–1.0)
Monocytes Relative: 10 %
Neutro Abs: 5.4 10*3/uL (ref 1.7–7.7)
Neutrophils Relative %: 66 %
Platelets: 286 10*3/uL (ref 150–400)
RBC: 4.15 MIL/uL (ref 3.87–5.11)
RDW: 13.7 % (ref 11.5–15.5)
WBC: 8.1 10*3/uL (ref 4.0–10.5)
nRBC: 0 % (ref 0.0–0.2)

## 2020-10-23 LAB — LACTIC ACID, PLASMA: Lactic Acid, Venous: 1.4 mmol/L (ref 0.5–1.9)

## 2020-10-23 LAB — TROPONIN I (HIGH SENSITIVITY): Troponin I (High Sensitivity): 6 ng/L (ref <18)

## 2020-10-23 LAB — COMPREHENSIVE METABOLIC PANEL
ALT: 13 U/L (ref 0–44)
AST: 15 U/L (ref 15–41)
Albumin: 3.6 g/dL (ref 3.5–5.0)
Alkaline Phosphatase: 84 U/L (ref 38–126)
Anion gap: 10 (ref 5–15)
BUN: 18 mg/dL (ref 8–23)
CO2: 30 mmol/L (ref 22–32)
Calcium: 9.2 mg/dL (ref 8.9–10.3)
Chloride: 103 mmol/L (ref 98–111)
Creatinine, Ser: 0.69 mg/dL (ref 0.44–1.00)
GFR, Estimated: >60 mL/min (ref >60)
Glucose, Bld: 97 mg/dL (ref 70–99)
Potassium: 4.1 mmol/L (ref 3.5–5.1)
Sodium: 143 mmol/L (ref 135–145)
Total Bilirubin: 0.4 mg/dL (ref 0.3–1.2)
Total Protein: 7 g/dL (ref 6.5–8.1)

## 2020-10-23 LAB — LIPASE, BLOOD: Lipase: 34 U/L (ref 11–51)

## 2020-10-23 LAB — BRAIN NATRIURETIC PEPTIDE: B Natriuretic Peptide: 314.1 pg/mL — ABNORMAL HIGH (ref 0.0–100.0)

## 2020-10-23 LAB — CK: Total CK: 49 U/L (ref 38–234)

## 2020-10-23 MED ORDER — LIDOCAINE HCL (PF) 1 % IJ SOLN
5.0000 mL | Freq: Once | INTRAMUSCULAR | Status: AC
  Administered 2020-10-23: 5 mL
  Filled 2020-10-23: qty 5

## 2020-10-23 NOTE — Discharge Instructions (Signed)
You have been seen in the emergency department after a fall.  Your work-up has shown largely normal results besides a laceration to your right knee.  This has been repaired with sutures that will need to be removed in approximately 10 days.  Please follow-up with your doctor for suture removal.  Return to the emergency department for any symptom personally concerning to yourself or staff members.

## 2020-10-23 NOTE — ED Notes (Signed)
Report called to Aileen Fass, RN.

## 2020-10-23 NOTE — ED Triage Notes (Signed)
Per EMS pt has some dementia at baseline. Pt unable to answer most questions and states "I don't know".

## 2020-10-23 NOTE — ED Provider Notes (Signed)
[facility] Emergency Department Provider Note  Time seen: 7:42 AM  I have reviewed the triage vital signs and the nursing notes.   HISTORY  Chief Complaint Fall   HPI Heather [name] is a 79 y.o. female with a past medical history of COPD, hypertension, mild dementia, presents to the emergency department after a fall from her nursing facility.  Patient believes she was try to get up to go to the restroom overnight but apparently fell while getting out of bed.  Patient states she fell onto her right side.  Is not sure if she hit her head.  Patient states mild right shoulder pain as well as right knee pain.  Denies any other pain or issues at this time.   Past Medical History:  Diagnosis Date  . COPD (chronic obstructive pulmonary disease) (HCC)   . Hypertension   . Macular degeneration     Patient Active Problem List   Diagnosis Date Noted  . COPD (chronic obstructive pulmonary disease) (HCC) 03/21/2018    Past Surgical History:  Procedure Laterality Date  . JOINT REPLACEMENT     Lt hip replacement    Prior to Admission medications   Medication Sig Start Date End Date Taking? Authorizing Provider  acetaminophen (TYLENOL) 325 MG tablet Take 650 mg by mouth every 6 (six) hours as needed.     [provider]  albuterol (PROVENTIL HFA;VENTOLIN HFA) 108 (90 Base) MCG/ACT inhaler Inhale 1 puff into the lungs every 6 (six) hours as needed for wheezing or shortness of breath.    [provider]  calcium carbonate (TUMS - DOSED IN MG ELEMENTAL CALCIUM) 500 MG chewable tablet Chew 1 tablet by mouth every 8 (eight) hours as needed for indigestion or heartburn.    [provider]  docusate sodium (COLACE) 100 MG capsule Take 100 mg by mouth daily.    [provider]  ferrous sulfate 325 (65 FE) MG EC tablet Take 325 mg by mouth every morning.    [provider]  Fluticasone-Salmeterol (ADVAIR) 250-50 MCG/DOSE AEPB Inhale 1  puff into the lungs every 12 (twelve) hours.    [provider]  lidocaine (LIDODERM) 5 % Place 1 patch onto the skin daily. Remove & Discard patch within 12 hours or as directed by MD    [provider]  loratadine (CLARITIN) 5 MG chewable tablet Chew 5 mg by mouth daily.    [provider]  Melatonin 3 MG TABS Take 3 mg by mouth at bedtime.    [provider]  metoprolol tartrate (LOPRESSOR) 25 MG tablet Take 1 tablet (25 mg total) by mouth 2 (two) times daily. 03/22/18   Salary, Evelena Asa, MD  Multiple Vitamin (MULTIVITAMIN WITH MINERALS) TABS tablet Take 1 tablet by mouth every morning.    [provider]  omeprazole (PRILOSEC) 40 MG capsule Take 40 mg by mouth every morning.    [provider]  oxyCODONE (OXY IR/ROXICODONE) 5 MG immediate release tablet Take 5 mg by mouth every 6 (six) hours as needed for severe pain.    [provider]  Polyethyl Glycol-Propyl Glycol (SYSTANE) 0.4-0.3 % SOLN Place 1 drop into both eyes daily as needed.    [provider]  polyethylene glycol (MIRALAX / GLYCOLAX) packet Take 17 g by mouth daily as needed for mild constipation.     [provider]  pregabalin (LYRICA) 150 MG capsule Take 150 mg by mouth every 12 (twelve) hours.    [provider]  QUEtiapine (SEROQUEL) 25 MG tablet Take 25 mg by mouth at bedtime.    [provider]  sertraline (ZOLOFT) 50 MG tablet Take 50 mg by mouth daily.    [provider]    No Known Allergies  History reviewed. No pertinent family history.  Social History Social History   Tobacco Use  . Smoking status: Former Games developer  . Smokeless tobacco: Never Used  Vaping Use  . Vaping Use: Never used  Substance Use Topics  . Alcohol use: Not Currently  . Drug use: Not Currently    Review of Systems Constitutional: Negative for loss of consciousness Cardiovascular: Negative for chest pain. Respiratory: Negative for  shortness of breath. Gastrointestinal: Negative for abdominal pain Musculoskeletal: Mild right shoulder pain Skin: Cut to her right knee Neurological: Mild headache per patient All other ROS negative, although possibly limited due to mild dementia.  ____________________________________________   PHYSICAL EXAM:  VITAL SIGNS: ED Triage Vitals  Enc Vitals Group     BP 10/23/20 0627 (!) 157/73     Pulse Rate 10/23/20 0627 (!) 56     Resp 10/23/20 0627 (!) 22     Temp 10/23/20 0627 97.8 F (36.6 C)     Temp Source 10/23/20 0627 Oral     SpO2 10/23/20 0627 100 %     Weight 10/23/20 0629 130 lb (59 kg)     Height 10/23/20 0629 5\' 2"  (1.575 m)     Head Circumference --      Peak Flow --      Pain Score --      Pain Loc --      Pain Edu? --      Excl. in [place]? --    Constitutional: Patient is awake and alert, no acute distress. Eyes: Normal exam ENT      Head: Normocephalic and atraumatic.      Mouth/Throat: Mucous membranes are moist. Cardiovascular: Normal rate, regular rhythm.  Respiratory: Normal respiratory effort without tachypnea nor retractions. Breath sounds are clear  Gastrointestinal: Soft and nontender. No distention.  Musculoskeletal: Nontender with normal range of motion in all extremities.  Patient does have an approximate 5 cm laceration to her right knee which appears to be more of a skin tear but there is subcutaneous tissue exposed.  We will suture. Neurologic:  Normal speech and language. No gross focal neurologic deficits  Skin: 5 cm laceration right knee Psychiatric: Mood and affect are normal.      RADIOLOGY  CT scans are negative for acute abnormality. X-rays are negative for acute abnormality.  ____________________________________________   INITIAL IMPRESSION / ASSESSMENT AND PLAN / ED COURSE  Pertinent labs & imaging results that were available during my care of the patient were reviewed by me and considered in my medical decision making (see  chart for details).   Patient presents emergency department after a fall, has a right knee skin tear/laceration which will require suture repair.  We will obtain CT images of the head and C-spine, x-rays of the right knee right shoulder and chest as precaution.  Overall the patient appears well, no distress.  Is complaining of moderate shoulder pain but has great range of motion.  Good range of motion in all other extremities as well.  Patient's imaging is largely nonrevealing.  X-rays are negative.  CT scans are negative.  Laceration repaired by myself.  Lab work is pending.   Lab work is reassuring.  Patient will be discharged home.  LACERATION REPAIR Performed by: Minna Antis Authorized by: Minna Antis Consent: Verbal consent obtained. Risks and benefits: risks, benefits and alternatives were discussed Consent given by: patient Patient identity confirmed: provided demographic data Prepped and Draped in normal sterile fashion Wound explored  Laceration Location: Right knee  Laceration Length: 5 cm  No Foreign Bodies seen or palpated  Anesthesia: local infiltration  Local anesthetic: lidocaine 1% without epinephrine  Anesthetic total: 5 ml  Irrigation method: syringe Amount of cleaning: standard  Skin closure: 4-0 nylon  Number of sutures: 4  Technique: Simple interrupted  Patient tolerance: Patient tolerated the procedure well with no immediate complications.   Amea Mcphail was evaluated in Emergency Department on 10/23/2020 for the symptoms described in the history of present illness. She was evaluated in the context of the global COVID-19 pandemic, which necessitated consideration that the patient might be at risk for infection with the SARS-CoV-2 virus that causes COVID-19. Institutional protocols and algorithms that pertain to the evaluation of patients at risk for COVID-19 are in a state of rapid change based on information released by regulatory  bodies including the CDC and federal and state organizations. These policies and algorithms were followed during the patient's care in the ED.  ____________________________________________   FINAL CLINICAL IMPRESSION(S) / ED DIAGNOSES  Fall Laceration   Minna Antis, MD 10/23/20 1123

## 2020-10-23 NOTE — ED Notes (Signed)
Pt unable to sign for DC d/t dementia. Pt taken back to [facility] by [facility].

## 2020-10-23 NOTE — ED Notes (Signed)
Repeat lactic and troponin not needed, per provider

## 2020-10-23 NOTE — ED Triage Notes (Signed)
Pt from [facility] via [facility]. Pt found fallen out of bed, unknown downtime. Pt has laceration on right knee, approximately 4 inches with fatty tissue exposed.  Pt states she hit her head. Pt has R shoulder pain.  Pt on 3L chronic O2, was off O2 for unknown amount of time. Pt has audible expiratory wheezes.

## 2020-11-28 ENCOUNTER — Other Ambulatory Visit: Payer: Self-pay

## 2020-11-28 ENCOUNTER — Non-Acute Institutional Stay: Payer: Medicare Other | Admitting: Adult Health Nurse Practitioner

## 2020-11-28 DIAGNOSIS — J449 Chronic obstructive pulmonary disease, unspecified: Secondary | ICD-10-CM

## 2020-11-28 DIAGNOSIS — Z515 Encounter for palliative care: Secondary | ICD-10-CM

## 2020-11-28 DIAGNOSIS — F339 Major depressive disorder, recurrent, unspecified: Secondary | ICD-10-CM

## 2020-11-28 DIAGNOSIS — R5381 Other malaise: Secondary | ICD-10-CM

## 2020-11-28 NOTE — Progress Notes (Signed)
Therapist, nutritional Palliative Care Consult Note Telephone: [phone]  Fax: [phone]  PATIENT NAME: Heather [name] DOB: [birth year] MRN: [id number]  PRIMARY CARE PROVIDER:   Almetta Lovely, [facility]  REFERRING PROVIDER:  [facility] [address] [place],  [place] [address]  RESPONSIBLE PARTY:   Gerome Apley, son/POA [phone]  Chief complaint: Initial palliative visit to discuss complex medical decision making    RECOMMENDATIONS and PLAN:  1.  Advanced care planning.  Son states that his mother is a DNR.  States that she has not always been comfortable with this decision.  Today patient states that she is not afraid to die and that if it is her time to be with her lower than she is ready.    2.  COPD.  Patient having stable DOE.  Does not complain of worsening shortness of breath or cough.  Uses O2 at 3 L.  Does use Advair daily.  Continue current plan of care  3.  Depression.  Staff does report that she will have episodes where she is yelling and screaming.  Patient is on Ativan gel as needed, Remeron 15 mg nightly, trazodone 50 mg nightly, Zoloft 100 mg daily, and Zyprexa 7.5 mg nightly.  She is being followed by psych NP at facility.  Continue follow-up and recommendations by psych  4.  Debility.  Discussed with son ALF versus SNF.  He states that he is working with ALF facility now to fill out paperwork for possible transition to SNF as she is requiring 2 person assist with transfers and with all ADLs except eating.  Discussed hospice versus palliative.  Discussed that though his mother is having a functional decline her appetite is still good and she is not losing weight and her COPD is stable at this time.  Son is having questions about transition from ALF to SNF and is uncertain of how he is going to pay for it.  We will put in SW referral  Patient is having functional decline.  Her COPD and nutritional status are  stable.  Palliative will continue to monitor for symptom management/decline and make recommendations as needed.  Follow-up in 6 to 8 weeks.  Son encouraged to call with any questions or concerns.  HISTORY OF PRESENT ILLNESS:  Heather [name] is a 79 y.o. 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 Palliative Care was asked to help address goals of care.  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  This is when she started living in assisted living facility.  After this she was getting around with a walker.  Son states that about a year and a half ago she fell and broke her hip requiring hip replacement and has not been able to walk since.  States that she was able to assist with transfers up until just a few months ago.  Now she is a 2 person assist in looking into possible placement in SNF.    Patient states having chest pain that she classifies as not bad and relates it to indigestion.  States that this chest pain is relieved with Mylanta.  Son states that her weight has maintained about the same.  Weight seems to be consistent around 140s.  Patient had fall 10/23/2020 and was evaluated in the ED.  She did  have laceration to right knee which required suturing.  This has resolved.  Reviewed labs and imaging from her ED visit with no acute findings.  Patient has stable DOE.  Rest of 10 point ROS asked and negative, though may be unreliable secondary to cognitive impairment  CODE STATUS: DNR  PPS: 30% HOSPICE ELIGIBILITY/DIAGNOSIS: TBD  PHYSICAL EXAM:  HR 64 O2 93% on 3 L General: NAD, frail appearing Eyes: Sclera anicteric and noninjected with no discharge noted EN MT: Moist mucous membranes Cardiovascular: regular rate and rhythm Pulmonary: Lung sounds clear; normal  respiratory effort Abdomen: soft, nontender, + bowel sounds Extremities: no edema, patient has kyphosis with scoliosis and contractures noted to feet Skin: no rashes on exposed skin Neurological: Weakness; alert and oriented to person and place  Family History  . Vision loss Father  . Macular degeneration Father  . High blood pressure (Hypertension) Father  . Vision loss Brother  . Diabetes type II Mother  . High blood pressure (Hypertension) Mother  . Diabetes type II Maternal Aunt  . Diabetes type II Maternal Uncle  . Diabetes type II Maternal Grandmother  . Diabetes type II Maternal Grandfather     PAST MEDICAL HISTORY:  Past Medical History:  Diagnosis Date  . COPD (chronic obstructive pulmonary disease) (HCC)   . Hypertension   . Macular degeneration     SOCIAL HX:  Social History   Tobacco Use  . Smoking status: Former Games developer  . Smokeless tobacco: Never Used  Substance Use Topics  . Alcohol use: Not Currently    ALLERGIES: No Known Allergies   PERTINENT MEDICATIONS:  Outpatient Encounter Medications as of 11/28/2020  Medication Sig  . acetaminophen (TYLENOL) 325 MG tablet Take 650 mg by mouth every 6 (six) hours as needed.   Marland Kitchen albuterol (PROVENTIL HFA;VENTOLIN HFA) 108 (90 Base) MCG/ACT inhaler Inhale 1 puff into the lungs every 6 (six) hours as needed for wheezing or shortness of breath.  . calcium carbonate (TUMS - DOSED IN MG ELEMENTAL CALCIUM) 500 MG chewable tablet Chew 1 tablet by mouth every 8 (eight) hours as needed for indigestion or heartburn.  . docusate sodium (COLACE) 100 MG capsule Take 100 mg by mouth daily.  . ferrous sulfate 325 (65 FE) MG EC tablet Take 325 mg by mouth every morning.  . Fluticasone-Salmeterol (ADVAIR) 250-50 MCG/DOSE AEPB Inhale 1 puff into the lungs every 12 (twelve) hours.  . lidocaine (LIDODERM) 5 % Place 1 patch onto the skin daily. Remove & Discard patch within 12 hours or as directed by MD  . loratadine (CLARITIN) 5 MG  chewable tablet Chew 5 mg by mouth daily.  . Melatonin 3 MG TABS Take 3 mg by mouth at bedtime.  . metoprolol tartrate (LOPRESSOR) 25 MG tablet Take 1 tablet (25 mg total) by mouth 2 (two) times daily.  . Multiple Vitamin (MULTIVITAMIN WITH MINERALS) TABS tablet Take 1 tablet by mouth every morning.  Marland Kitchen omeprazole (PRILOSEC) 40 MG capsule Take 40 mg by mouth every morning.  Marland Kitchen oxyCODONE (OXY IR/ROXICODONE) 5 MG immediate release tablet Take 5 mg by mouth every 6 (six) hours as needed for severe pain.  Bertram Gala Glycol-Propyl Glycol (SYSTANE) 0.4-0.3 % SOLN Place 1 drop into both eyes daily as needed.  . polyethylene glycol (MIRALAX / GLYCOLAX) packet Take 17 g by mouth daily as needed for mild constipation.   . pregabalin (LYRICA) 150 MG capsule Take 150 mg by mouth every 12 (twelve) hours.  Marland Kitchen QUEtiapine (SEROQUEL) 25 MG tablet  Take 25 mg by mouth at bedtime.  . sertraline (ZOLOFT) 50 MG tablet Take 50 mg by mouth daily.   No facility-administered encounter medications on file as of 11/28/2020.     Yao Hyppolite Marlena Clipper, NP

## 2021-02-08 ENCOUNTER — Non-Acute Institutional Stay: Payer: Medicare Other | Admitting: Adult Health Nurse Practitioner

## 2021-02-08 ENCOUNTER — Other Ambulatory Visit: Payer: Self-pay

## 2021-02-08 DIAGNOSIS — Z515 Encounter for palliative care: Secondary | ICD-10-CM

## 2021-02-08 DIAGNOSIS — R5381 Other malaise: Secondary | ICD-10-CM

## 2021-02-08 DIAGNOSIS — J449 Chronic obstructive pulmonary disease, unspecified: Secondary | ICD-10-CM

## 2021-02-08 NOTE — Progress Notes (Signed)
Therapist, nutritional Palliative Care Consult Note Telephone: [phone]  Fax: [phone]  PATIENT NAME: Heather [name] DOB: 11/02/1941 MRN: [id number]  PRIMARY CARE PROVIDER:   Almetta [facility]  REFERRING PROVIDER:  [facility] [address] [place],  [place] [address]  RESPONSIBLE PARTY:   Gerome Apley, son/POA [phone]  Chief complaint: Follow-up palliative visit to discuss complex medical decision making    RECOMMENDATIONS and PLAN:  1.  Advanced care planning.  Patient is DNR.  Spoke with son via telephone to update on today's visit  2.  COPD.  Patient is stable at this time.  Uses O2 at 3 L and Advair daily.  Continue current plan of care.  3.  Debility.  Patient starting to require more assistance than can be provided at present ALF.  Son is trying to locate another facility that can meet her needs and that they are able to afford.  4.  Support.  Spoke with RN at facility they have limited resources to offer in finding the assistance that the patient needs in locating appropriate facility for her needs.  Son would like further help on this.  We will put in social work referral.  Palliative will continue to monitor for symptom management/decline and make recommendations as needed.  Follow-up in 4 to 6 weeks.  Son encouraged to call with any questions or concerns  I spent 40 minutes providing this consultation, including time spent with patient/family, provider coordination, documentation, chart review. More than 50% of the time in this consultation was spent coordinating communication.   HISTORY OF PRESENT ILLNESS:  Heather [name] is a 80 y.o. 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 Palliative Care was asked to help address goals of care.  Patient has no new concerns  today.  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  Patient does state today that the year is 57 and is talking about events from years ago.  Patient does state that she has a fear of falling and requires assistance with transfers.  She has not had any recent falls.  Son does state that given trying to transition her to skilled nursing facility.  She brings in too much monthly income to get Medicaid and they are not able to afford out-of-pocket expenses for room and board at SNF.  Staff's only concern is for safety as she is requiring more assistance with transfers and have been trying to help son locate other facilities that would be able to accommodate her needs.  No other new concerns today.  Patient's appetite is good with no reported weight loss.  Patient has not had any falls, infection, hospital visits since last visit.  CODE STATUS: DNR  PPS: 30% HOSPICE ELIGIBILITY/DIAGNOSIS: TBD  PHYSICAL EXAM:  HR 63 O2 92% on 3 L General: NAD, frail appearing Eyes: Sclera anicteric and noninjected with no discharge noted EN MT: Moist mucous membranes Cardiovascular: regular rate and rhythm Pulmonary: Lung sounds clear; normal respiratory effort Abdomen: soft, nontender, + bowel sounds Extremities: no edema, patient has kyphosis with scoliosis and contractures noted to feet Skin: no rashes on exposed skin Neurological: Weakness; alert and oriented to person and place   PAST MEDICAL HISTORY:  Past Medical History:  Diagnosis Date  . COPD (chronic obstructive pulmonary disease) (HCC)   . Hypertension   . Macular degeneration     SOCIAL  HX:  Social History   Tobacco Use  . Smoking status: Former Games developer  . Smokeless tobacco: Never Used  Substance Use Topics  . Alcohol use: Not Currently    ALLERGIES: No Known Allergies   PERTINENT MEDICATIONS:  Outpatient Encounter Medications as of 82/16/2022  Medication Sig  . acetaminophen (TYLENOL) 325 MG tablet Take  650 mg by mouth every 6 (six) hours as needed.   Marland Kitchen albuterol (PROVENTIL HFA;VENTOLIN HFA) 108 (90 Base) MCG/ACT inhaler Inhale 1 puff into the lungs every 6 (six) hours as needed for wheezing or shortness of breath.  . calcium carbonate (TUMS - DOSED IN MG ELEMENTAL CALCIUM) 500 MG chewable tablet Chew 1 tablet by mouth every 8 (eight) hours as needed for indigestion or heartburn.  . docusate sodium (COLACE) 100 MG capsule Take 100 mg by mouth daily.  . ferrous sulfate 325 (65 FE) MG EC tablet Take 325 mg by mouth every morning.  . Fluticasone-Salmeterol (ADVAIR) 250-50 MCG/DOSE AEPB Inhale 1 puff into the lungs every 12 (twelve) hours.  . lidocaine (LIDODERM) 5 % Place 1 patch onto the skin daily. Remove & Discard patch within 12 hours or as directed by MD  . loratadine (CLARITIN) 5 MG chewable tablet Chew 5 mg by mouth daily.  . Melatonin 3 MG TABS Take 3 mg by mouth at bedtime.  . metoprolol tartrate (LOPRESSOR) 25 MG tablet Take 1 tablet (25 mg total) by mouth 2 (two) times daily.  . Multiple Vitamin (MULTIVITAMIN WITH MINERALS) TABS tablet Take 1 tablet by mouth every morning.  Marland Kitchen omeprazole (PRILOSEC) 40 MG capsule Take 40 mg by mouth every morning.  Marland Kitchen oxyCODONE (OXY IR/ROXICODONE) 5 MG immediate release tablet Take 5 mg by mouth every 6 (six) hours as needed for severe pain.  Bertram Gala Glycol-Propyl Glycol (SYSTANE) 0.4-0.3 % SOLN Place 1 drop into both eyes daily as needed.  . polyethylene glycol (MIRALAX / GLYCOLAX) packet Take 17 g by mouth daily as needed for mild constipation.   . pregabalin (LYRICA) 150 MG capsule Take 150 mg by mouth every 12 (twelve) hours.  Marland Kitchen QUEtiapine (SEROQUEL) 25 MG tablet Take 25 mg by mouth at bedtime.  . sertraline (ZOLOFT) 50 MG tablet Take 50 mg by mouth daily.   No facility-administered encounter medications on file as of 82/16/2022.    [name], NP

## 2021-02-12 ENCOUNTER — Telehealth: Payer: Self-pay

## 2021-02-12 NOTE — Telephone Encounter (Signed)
2/17 @1220pm . SW outreached patient son, , per NP request.   Son stated that patient is currently at [facility] ALF no longer meets their level of care. ALF is recommending SNF placement. Son shared that he is unsure what to do at this time due to being told that patient did not meet the income eligibility for Medicaid. SW discussed the different tiers of Medicaid and eligibility requirements for each. SW encouraged son to apply for LTC Medicaid on behalf of patient due to LTC Medicaid only considering assets for determination of eligibility versus monthly income. SW sent son a copy of a Medicaid application via email along with the onine link to apply if chooses to do so. Son appreciative of call and information. SW provided contact information if needed. Son had no other questions/concerns at this time.

## 2021-02-27 ENCOUNTER — Emergency Department: Payer: Medicare Other

## 2021-02-27 ENCOUNTER — Emergency Department
Admission: EM | Admit: 2021-02-27 | Discharge: 2021-02-27 | Disposition: A | Discharge status: Home / Self Care | Payer: Medicare Other | Attending: Emergency Medicine | Admitting: Emergency Medicine

## 2021-02-27 ENCOUNTER — Encounter: Payer: Self-pay | Admitting: Emergency Medicine

## 2021-02-27 ENCOUNTER — Other Ambulatory Visit: Payer: Self-pay

## 2021-02-27 DIAGNOSIS — Z79899 Other long term (current) drug therapy: Secondary | ICD-10-CM | POA: Diagnosis not present

## 2021-02-27 DIAGNOSIS — J449 Chronic obstructive pulmonary disease, unspecified: Secondary | ICD-10-CM | POA: Insufficient documentation

## 2021-02-27 DIAGNOSIS — W19XXXA Unspecified fall, initial encounter: Secondary | ICD-10-CM | POA: Insufficient documentation

## 2021-02-27 DIAGNOSIS — K5732 Diverticulitis of large intestine without perforation or abscess without bleeding: Secondary | ICD-10-CM

## 2021-02-27 DIAGNOSIS — Z96642 Presence of left artificial hip joint: Secondary | ICD-10-CM | POA: Insufficient documentation

## 2021-02-27 DIAGNOSIS — R410 Disorientation, unspecified: Secondary | ICD-10-CM | POA: Insufficient documentation

## 2021-02-27 DIAGNOSIS — M25552 Pain in left hip: Secondary | ICD-10-CM | POA: Diagnosis present

## 2021-02-27 DIAGNOSIS — I10 Essential (primary) hypertension: Secondary | ICD-10-CM | POA: Insufficient documentation

## 2021-02-27 DIAGNOSIS — Z7951 Long term (current) use of inhaled steroids: Secondary | ICD-10-CM | POA: Diagnosis not present

## 2021-02-27 DIAGNOSIS — Z87891 Personal history of nicotine dependence: Secondary | ICD-10-CM | POA: Diagnosis not present

## 2021-02-27 LAB — URINALYSIS, COMPLETE (UACMP) WITH MICROSCOPIC
Bilirubin Urine: NEGATIVE
Glucose, UA: NEGATIVE mg/dL
Hgb urine dipstick: NEGATIVE
Ketones, ur: NEGATIVE mg/dL
Nitrite: NEGATIVE
Protein, ur: NEGATIVE mg/dL
Specific Gravity, Urine: 1.014 (ref 1.005–1.030)
pH: 6 (ref 5.0–8.0)

## 2021-02-27 LAB — CBC WITH DIFFERENTIAL/PLATELET
Abs Immature Granulocytes: 0.02 10*3/uL (ref 0.00–0.07)
Basophils Absolute: 0 10*3/uL (ref 0.0–0.1)
Basophils Relative: 1 %
Eosinophils Absolute: 0.2 10*3/uL (ref 0.0–0.5)
Eosinophils Relative: 3 %
HCT: 34.3 % — ABNORMAL LOW (ref 36.0–46.0)
Hemoglobin: 10.7 g/dL — ABNORMAL LOW (ref 12.0–15.0)
Immature Granulocytes: 0 %
Lymphocytes Relative: 20 %
Lymphs Abs: 1.7 10*3/uL (ref 0.7–4.0)
MCH: 27.8 pg (ref 26.0–34.0)
MCHC: 31.2 g/dL (ref 30.0–36.0)
MCV: 89.1 fL (ref 80.0–100.0)
Monocytes Absolute: 0.9 10*3/uL (ref 0.1–1.0)
Monocytes Relative: 10 %
Neutro Abs: 5.5 10*3/uL (ref 1.7–7.7)
Neutrophils Relative %: 66 %
Platelets: 219 10*3/uL (ref 150–400)
RBC: 3.85 MIL/uL — ABNORMAL LOW (ref 3.87–5.11)
RDW: 14.6 % (ref 11.5–15.5)
WBC: 8.3 10*3/uL (ref 4.0–10.5)
nRBC: 0 % (ref 0.0–0.2)

## 2021-02-27 LAB — COMPREHENSIVE METABOLIC PANEL
ALT: 13 U/L (ref 0–44)
AST: 14 U/L — ABNORMAL LOW (ref 15–41)
Albumin: 3.5 g/dL (ref 3.5–5.0)
Alkaline Phosphatase: 77 U/L (ref 38–126)
Anion gap: 5 (ref 5–15)
BUN: 28 mg/dL — ABNORMAL HIGH (ref 8–23)
CO2: 30 mmol/L (ref 22–32)
Calcium: 9.2 mg/dL (ref 8.9–10.3)
Chloride: 107 mmol/L (ref 98–111)
Creatinine, Ser: 0.86 mg/dL (ref 0.44–1.00)
GFR, Estimated: >60 mL/min (ref >60)
Glucose, Bld: 82 mg/dL (ref 70–99)
Potassium: 4.8 mmol/L (ref 3.5–5.1)
Sodium: 142 mmol/L (ref 135–145)
Total Bilirubin: 0.5 mg/dL (ref 0.3–1.2)
Total Protein: 6.5 g/dL (ref 6.5–8.1)

## 2021-02-27 MED ORDER — CIPROFLOXACIN HCL 500 MG PO TABS: 500.0000 mg | ORAL_TABLET | Freq: Two times a day (BID) | ORAL | 0 refills | Status: AC

## 2021-02-27 MED ORDER — CIPROFLOXACIN IN D5W 400 MG/200ML IV SOLN
400.0000 mg | Freq: Once | INTRAVENOUS | Status: AC
  Administered 2021-02-27: 400 mg via INTRAVENOUS
  Filled 2021-02-27: qty 200

## 2021-02-27 MED ORDER — ACETAMINOPHEN 500 MG PO TABS
1000.0000 mg | ORAL_TABLET | Freq: Once | ORAL | Status: AC
  Administered 2021-02-27: 1000 mg via ORAL
  Filled 2021-02-27: qty 2

## 2021-02-27 MED ORDER — METRONIDAZOLE IN NACL 5-0.79 MG/ML-% IV SOLN
500.0000 mg | Freq: Once | INTRAVENOUS | Status: AC
  Administered 2021-02-27: 500 mg via INTRAVENOUS
  Filled 2021-02-27: qty 100

## 2021-02-27 MED ORDER — METRONIDAZOLE 500 MG PO TABS: 500.0000 mg | ORAL_TABLET | Freq: Two times a day (BID) | ORAL | 0 refills | Status: AC

## 2021-02-27 NOTE — ED Triage Notes (Signed)
Pt presents to the ED via EMS from [facility] dementia care unit. Per EMS, pt had an unwitnessed fall today. When asked if the pt got dizzy and fell pt denied. Pt states, "the track wouldn't stop" unassuming it was her lifting recliner. Pt on 2L Sneads chronically. Denies blood thinner use. Denies head injury. Pt having L hip pain. Pt is alert to self only.

## 2021-02-27 NOTE — ED Provider Notes (Signed)
[facility] Emergency Department Provider Note ____________________________________________   Event Date/Time   First MD Initiated Contact with Patient 02/27/21 1033     (approximate)  I have reviewed the triage vital signs and the nursing notes.  HISTORY  Chief Complaint Fall and Hip Pain   HPI Heather [name] is a 80 y.o. femalewho presents to the ED for evaluation of hip pain after a fall.   Chart review indicates hx HTN, COPD  Patient presents to the ED for evaluation of left hip pain.  She presents via EMS from a local SNF.  She was reportedly disoriented at her baseline, but complaining of left hip pain.  EMS reports that her memory care unit has some concerned that she may have fallen, but they are uncertain of any discrete injuries.  Here in the ED, patient reports left hip pain as her only complaint.  She is unable to provide any relevant history due to her disorientation, thereby limiting HPI.   Past Medical History:  Diagnosis Date  . COPD (chronic obstructive pulmonary disease) (HCC)   . Hypertension   . Macular degeneration     Patient Active Problem List   Diagnosis Date Noted  . COPD (chronic obstructive pulmonary disease) (HCC) 03/21/2018    Past Surgical History:  Procedure Laterality Date  . JOINT REPLACEMENT     Lt hip replacement    Prior to Admission medications   Medication Sig Start Date End Date Taking? Authorizing Provider  ciprofloxacin (CIPRO) 500 MG tablet Take 1 tablet (500 mg total) by mouth 2 (two) times daily for 7 days. 02/27/21 03/06/21 Yes Delton Prairie, MD  metroNIDAZOLE (FLAGYL) 500 MG tablet Take 1 tablet (500 mg total) by mouth 2 (two) times daily for 7 days. 02/27/21 03/06/21 Yes Delton Prairie, MD  acetaminophen (TYLENOL) 325 MG tablet Take 650 mg by mouth every 6 (six) hours as needed.     [provider]  albuterol (PROVENTIL HFA;VENTOLIN HFA) 108 (90 Base) MCG/ACT inhaler Inhale 1 puff into the lungs  every 6 (six) hours as needed for wheezing or shortness of breath.    [provider]  calcium carbonate (TUMS - DOSED IN MG ELEMENTAL CALCIUM) 500 MG chewable tablet Chew 1 tablet by mouth every 8 (eight) hours as needed for indigestion or heartburn.    [provider]  docusate sodium (COLACE) 100 MG capsule Take 100 mg by mouth daily.    [provider]  ferrous sulfate 325 (65 FE) MG EC tablet Take 325 mg by mouth every morning.    [provider]  Fluticasone-Salmeterol (ADVAIR) 250-50 MCG/DOSE AEPB Inhale 1 puff into the lungs every 12 (twelve) hours.    [provider]  lidocaine (LIDODERM) 5 % Place 1 patch onto the skin daily. Remove & Discard patch within 12 hours or as directed by MD    [provider]  loratadine (CLARITIN) 5 MG chewable tablet Chew 5 mg by mouth daily.    [provider]  Melatonin 3 MG TABS Take 3 mg by mouth at bedtime.    [provider]  metoprolol tartrate (LOPRESSOR) 25 MG tablet Take 1 tablet (25 mg total) by mouth 2 (two) times daily. 03/22/18   Salary, Evelena Asa, MD  Multiple Vitamin (MULTIVITAMIN WITH MINERALS) TABS tablet Take 1 tablet by mouth every morning.    [provider]  omeprazole (PRILOSEC) 40 MG capsule Take 40 mg by mouth every morning.    [provider]  oxyCODONE (OXY IR/ROXICODONE) 5 MG immediate release tablet Take 5 mg by mouth every 6 (six) hours as needed for severe pain.    [provider]  Polyethyl Glycol-Propyl Glycol (SYSTANE) 0.4-0.3 % SOLN Place 1 drop into both eyes daily as needed.    [provider]  polyethylene glycol (MIRALAX / GLYCOLAX) packet Take 17 g by mouth daily as needed for mild constipation.     [provider]  pregabalin (LYRICA) 150 MG capsule Take 150 mg by mouth every 12 (twelve) hours.    [provider]  QUEtiapine (SEROQUEL) 25 MG tablet Take 25 mg by mouth at bedtime.    [provider]  sertraline (ZOLOFT) 50 MG tablet Take 50 mg by mouth daily.    [provider]    Allergies Patient has no known allergies.  History reviewed. No pertinent family history.  Social History Social History   Tobacco Use  . Smoking status: Former Games developer  . Smokeless tobacco: Never Used  Vaping Use  . Vaping Use: Never used  Substance Use Topics  . Alcohol use: Not Currently  . Drug use: Not Currently    Review of Systems  Unable to be accurately assessed due to patient's disorientation ____________________________________________   PHYSICAL EXAM:  VITAL SIGNS: Vitals:   02/27/21 1324 02/27/21 1439  BP: (!) 147/86 102/61  Pulse: (!) 56 (!) 50  Resp: 18 18  Temp:    SpO2: 100% 99%     Constitutional: Alert and pleasantly disoriented.  Follows commands in all 4 extremities. Eyes: Conjunctivae are normal. PERRL. EOMI. Head: Atraumatic. Nose: No congestion/rhinnorhea. Mouth/Throat: Mucous membranes are moist.  Oropharynx non-erythematous. Neck: No stridor. No cervical spine tenderness to palpation. Cardiovascular: Normal rate, regular rhythm. Grossly normal heart sounds.  Good peripheral circulation. Respiratory: Normal respiratory effort.  No retractions. Lungs CTAB. Gastrointestinal: Soft , nondistended. No CVA tenderness. LLQ tenderness to palpation without peritoneal features.  Her abdomen is otherwise benign. Musculoskeletal: No lower extremity tenderness nor edema.  No joint effusions. No signs of acute trauma. Neurologic:  Normal speech and language. No gross focal neurologic deficits are appreciated.  Cranial nerves II through XII intact 5/5 strength and sensation in all 4 extremities -left hip/LLQ pain with left hip flexion, but with encouragement her strength is present and symmetric to the right. Skin:  Skin is warm, dry and intact. No rash noted. Psychiatric: Mood and affect are normal. Speech and behavior are  normal.  ____________________________________________   LABS (all labs ordered are listed, but only abnormal results are displayed)  Labs Reviewed  CBC WITH DIFFERENTIAL/PLATELET - Abnormal; Notable for the following components:      Result Value   RBC 3.85 (*)    Hemoglobin 10.7 (*)    HCT 34.3 (*)    All other components within normal limits  COMPREHENSIVE METABOLIC PANEL - Abnormal; Notable for the following components:   BUN 28 (*)    AST 14 (*)    All other components within normal limits  URINALYSIS, COMPLETE (UACMP) WITH MICROSCOPIC - Abnormal; Notable for the following components:   Color, Urine AMBER (*)    APPearance CLOUDY (*)    Leukocytes,Ua SMALL (*)    Bacteria, UA MANY (*)    All other components within normal limits    ____________________________________________  RADIOLOGY  ED MD interpretation: CT head reviewed by me without evidence of acute intracranial pathology. Plain film of the left pelvis reviewed by me without evidence of acute bony injury.  CT pelvis reviewed by me with diverticular disease and stranding  Official radiology report(s): CT Head Wo Contrast  Result Date: 02/27/2021 CLINICAL DATA:  Altered mental status with unwitnessed fall EXAM: CT HEAD WITHOUT CONTRAST TECHNIQUE: Contiguous axial images were obtained from the base of the skull through the vertex without intravenous contrast. COMPARISON:  October 23, 2020 FINDINGS: Brain: There is stable mild diffuse atrophy. Right lateral ventricle is larger than left lateral ventricle, likely due to ex vacuo phenomenon, stable. No intracranial mass, hemorrhage, extra-axial fluid collection, or midline shift. There is a prior infarct in the anterior superior right frontal lobe, stable. There is decreased attenuation throughout the centra semiovale bilaterally, stable. There is a smaller prior infarct in the anterior left frontal lobe. There is evidence of prior infarcts throughout portions of the internal  and external capsules on the left as well as in the left lentiform nucleus. Small vessel disease is also noted to a lesser degree in the right anterior basal ganglia regions. No acute infarct is evident. Vascular: No hyperdense vessel. Calcification is noted in each carotid siphon region. Skull: Bony calvarium appears intact. Sinuses/Orbits: There is opacity throughout much of the right maxillary antrum. There is mucosal thickening in the left maxillary antrum. There is mucosal thickening in several ethmoid air cells bilaterally. Orbits appear symmetric bilaterally except for evidence of prior cataract removal on the left. Other: Mastoid air cells are clear. Debris is noted in each external auditory canal. IMPRESSION: Stable atrophy with periventricular and basal ganglia regions small vessel disease. 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 No evident acute infarct. There are foci of arterial vascular calcification. There are multiple foci of paranasal sinus disease. There is probable cerumen in each external auditory canal. Electronically Signed   By: Bretta Bang III M.D.   On: 02/27/2021 12:08   CT PELVIS WO CONTRAST  Result Date: 02/27/2021 CLINICAL DATA:  Left hip pain after unwitnessed fall. History of left hip arthroplasty. EXAM: CT PELVIS WITHOUT CONTRAST TECHNIQUE: Multidetector CT imaging of the pelvis was performed following the standard protocol without intravenous contrast. COMPARISON:  X-ray 02/27/2021 FINDINGS: Urinary Tract: No abnormality visualized. Urinary bladder incompletely distended. Bowel: Extensive sigmoid diverticulosis. Mild pericolonic fat stranding adjacent to the proximal sigmoid colon (series 4, images 45-49). No bowel distention within the pelvis. Vascular/Lymphatic: Atherosclerotic calcifications of the visualized aortoiliac axis without aneurysm. No pelvic or inguinal lymphadenopathy. Reproductive:  Prior hysterectomy.  No adnexal  abnormality. Other:  No free fluid within the pelvis. Musculoskeletal: Postsurgical changes of prior left total hip arthroplasty. Arthroplasty components are aligned without dislocation. Protrusio deformity of the acetabular cup. No periprosthetic lucency or fracture. No periprosthetic fluid collection. Heterotopic ossification superior to the greater trochanter. Bony pelvis intact without fracture or diastasis. Mild arthropathy of the bilateral SI joints and pubic symphysis. Surgical scar site at the lateral aspect of the left hip. No soft tissue edema or fluid collection. Fatty infiltration of the left gluteal musculature. IMPRESSION: 1. No acute osseous abnormality of the pelvis. 2. Postsurgical changes of prior left total hip arthroplasty without evidence of acute hardware complication. Protrusio deformity of the acetabular component. 3. Extensive sigmoid diverticulosis with mild pericolonic fat stranding adjacent to the proximal sigmoid colon, which may represent mild acute diverticulitis. Correlate with patient's symptoms. 4. Aortic atherosclerosis (ICD10-I70.0). Electronically Signed   By: Duanne Guess D.O.   On: 02/27/2021 12:19   DG Hip Unilat With Pelvis 2-3 Views Left  Result Date:  02/27/2021 CLINICAL DATA:  Pain following fall EXAM: DG HIP (WITH OR WITHOUT PELVIS) 2-3V LEFT COMPARISON:  None. FINDINGS: Frontal pelvis as well as frontal and lateral left hip images were obtained. There is a total hip replacement on the left with mild protrusio acetabula on the left. Prosthetic components otherwise appear well seated. No acute fracture or dislocation. Moderate narrowing right hip joint. Areas of myositis ossificans noted near the hip replacement on the left. There is lower lumbar levoscoliosis. There is aortic atherosclerosis. Sacroiliac joints appear unremarkable. 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 Prosthetic components otherwise well seated on  the left. Osteoporosis. No acute fracture or dislocation. Moderate narrowing right hip joint. Degenerative change with scoliosis lumbar spine. Aortic Atherosclerosis (ICD10-I70.0). Electronically Signed   By: Bretta BangWilliam  Woodruff III M.D.   On: 02/27/2021 11:23    ____________________________________________   PROCEDURES and INTERVENTIONS  Procedure(s) performed (including Critical Care):  .1-3 Lead EKG Interpretation Performed by: Delton PrairieSmith, Lleyton Byers, MD Authorized by: Delton PrairieSmith, Va Broadwell, MD     Interpretation: normal     ECG rate:  56   ECG rate assessment: bradycardic     Rhythm: sinus bradycardia     Ectopy: none     Conduction: normal      Medications  metroNIDAZOLE (FLAGYL) IVPB 500 mg (500 mg Intravenous New Bag/Given 02/27/21 1439)  acetaminophen (TYLENOL) tablet 1,000 mg (1,000 mg Oral Given 02/27/21 1210)  ciprofloxacin (CIPRO) IVPB 400 mg (0 mg Intravenous Stopped 02/27/21 1337)    ____________________________________________   MDM / ED COURSE   80 year old woman presents from a local SNF with left hip pain, without evidence of bony injury or trauma, but with evidence of diverticulitis is the likely source of her pain, and ultimately amenable to return to facility.  Normal vitals on room air.  Exam with tenderness to her left pelvis and left lower quadrant abdomen, but without peritoneal signs or evidence of surgical abdomen.  No evidence of neurovascular deficits or any acute trauma on examination.  She has no distress.  Blood work with normocytic anemia at her baseline.  Urine without infectious features.  CT head demonstrates no evidence of ICH or CVA.  Plain film of the pelvis and hip without evidence of bony injury, fracture or dislocation.  Due to her continued pain and tenderness on examination, follow-up CT pelvis without contrast obtained, and further demonstrates reassuring musculoskeletal anatomy, but does demonstrate evidence of diverticulitis.  Patient was started on a course of  ciprofloxacin and metronidazole to treat this.  See no barriers to outpatient management at this time.  Will discharge with prescriptions for both of these.  Clinical Course as of 02/27/21 1516  Mon Feb 27, 2021  1227 I reevaluate the patient after CT.  LLQ tenderness to palpation is suggestive of diverticulitis.  No evidence of bony pathology in the pelvis. [DS]  1307 Patient calling out due to discomfort at IV sites.  Erythema noted just proximal to IV insertion site.  Ciprofloxacin cut back to half rate. [DS]    Clinical Course User Index [DS] Delton PrairieSmith, Jamari Moten, MD    ____________________________________________   FINAL CLINICAL IMPRESSION(S) / ED DIAGNOSES  Final diagnoses:  Diverticulitis of large intestine without perforation or abscess without bleeding     ED Discharge Orders         Ordered    ciprofloxacin (CIPRO) 500 MG tablet  2 times daily        02/27/21 1513  metroNIDAZOLE (FLAGYL) 500 MG tablet  2 times daily        02/27/21 1513           Saleh Ulbrich   Note:  This document was prepared using Conservation officer, historic buildings and may include unintentional dictation errors.   Delton Prairie, MD 02/27/21 [phone]

## 2021-02-27 NOTE — ED Notes (Signed)
Called [facility] for transport back to [place], [facility] is 6th on list

## 2021-02-27 NOTE — ED Notes (Signed)
Patient has reddness around IV site. IV still patent and Katrinka Blazing, MD aware. Per Katrinka Blazing, MD Cipro slowed down. To 193mL/hr.

## 2021-02-27 NOTE — Discharge Instructions (Signed)
Eddy has evidence of diverticulitis causing her left hip pain.  No fracture or bony injuries identified.  She is being discharged with 2 prescriptions for antibiotics: Ciprofloxacin to take twice daily for the next 7 days Flagyl to take twice daily for the next 7 days  Use Tylenol for pain and fevers.  Up to 1000 mg per dose, up to 4 times per day.  Do not take more than 4000 mg of Tylenol/acetaminophen within 24 hours..  Return to the ED with any worsening symptoms despite these medications.

## 2021-02-27 NOTE — ED Notes (Addendum)
Patient straight cath for urine by this RN and Luther Parody, NT. Patient to CT at this time.

## 2021-02-27 NOTE — ED Notes (Signed)
Report given to Elmarie Shiley, RN at [facility] for patient's return to facility.

## 2021-02-27 NOTE — ED Notes (Signed)
Patient unable to sign for self for discharge.  

## 2021-02-27 NOTE — ED Notes (Signed)
Patient sleeping at this time. No signs of distress. Will continue to monitor. 

## 2021-03-02 ENCOUNTER — Telehealth: Payer: Self-pay | Admitting: Adult Health Nurse Practitioner

## 2021-03-02 NOTE — Telephone Encounter (Signed)
Received call from nurse with state surveyor at patient's facility.  Attempted to answer her questions.   Sem Mccaughey K. Garner Nash NP

## 2021-04-06 ENCOUNTER — Emergency Department: Payer: 59

## 2021-04-06 ENCOUNTER — Other Ambulatory Visit: Payer: Self-pay

## 2021-04-06 ENCOUNTER — Encounter: Payer: Self-pay | Admitting: Emergency Medicine

## 2021-04-06 ENCOUNTER — Observation Stay
Admission: EM | Admit: 2021-04-06 | Discharge: 2021-04-08 | Disposition: A | Discharge status: Home / Self Care | Payer: 59 | Attending: Hospitalist | Admitting: Hospitalist

## 2021-04-06 DIAGNOSIS — Z79899 Other long term (current) drug therapy: Secondary | ICD-10-CM | POA: Diagnosis not present

## 2021-04-06 DIAGNOSIS — I10 Essential (primary) hypertension: Secondary | ICD-10-CM | POA: Diagnosis not present

## 2021-04-06 DIAGNOSIS — G9341 Metabolic encephalopathy: Secondary | ICD-10-CM | POA: Diagnosis not present

## 2021-04-06 DIAGNOSIS — R410 Disorientation, unspecified: Secondary | ICD-10-CM

## 2021-04-06 DIAGNOSIS — Z96642 Presence of left artificial hip joint: Secondary | ICD-10-CM | POA: Diagnosis not present

## 2021-04-06 DIAGNOSIS — Z20822 Contact with and (suspected) exposure to covid-19: Secondary | ICD-10-CM | POA: Diagnosis not present

## 2021-04-06 DIAGNOSIS — J449 Chronic obstructive pulmonary disease, unspecified: Secondary | ICD-10-CM | POA: Insufficient documentation

## 2021-04-06 DIAGNOSIS — J189 Pneumonia, unspecified organism: Secondary | ICD-10-CM

## 2021-04-06 DIAGNOSIS — Z87891 Personal history of nicotine dependence: Secondary | ICD-10-CM | POA: Diagnosis not present

## 2021-04-06 DIAGNOSIS — Z515 Encounter for palliative care: Secondary | ICD-10-CM

## 2021-04-06 DIAGNOSIS — N390 Urinary tract infection, site not specified: Secondary | ICD-10-CM

## 2021-04-06 DIAGNOSIS — R4182 Altered mental status, unspecified: Secondary | ICD-10-CM | POA: Diagnosis present

## 2021-04-06 DIAGNOSIS — F0392 Unspecified dementia, unspecified severity, with psychotic disturbance: Secondary | ICD-10-CM | POA: Diagnosis present

## 2021-04-06 LAB — CBC WITH DIFFERENTIAL/PLATELET
Abs Immature Granulocytes: 0.02 10*3/uL (ref 0.00–0.07)
Basophils Absolute: 0.1 10*3/uL (ref 0.0–0.1)
Basophils Relative: 1 %
Eosinophils Absolute: 0.2 10*3/uL (ref 0.0–0.5)
Eosinophils Relative: 3 %
HCT: 34.6 % — ABNORMAL LOW (ref 36.0–46.0)
Hemoglobin: 10.6 g/dL — ABNORMAL LOW (ref 12.0–15.0)
Immature Granulocytes: 0 %
Lymphocytes Relative: 33 %
Lymphs Abs: 1.9 10*3/uL (ref 0.7–4.0)
MCH: 27.5 pg (ref 26.0–34.0)
MCHC: 30.6 g/dL (ref 30.0–36.0)
MCV: 89.9 fL (ref 80.0–100.0)
Monocytes Absolute: 0.6 10*3/uL (ref 0.1–1.0)
Monocytes Relative: 10 %
Neutro Abs: 3.1 10*3/uL (ref 1.7–7.7)
Neutrophils Relative %: 53 %
Platelets: 179 10*3/uL (ref 150–400)
RBC: 3.85 MIL/uL — ABNORMAL LOW (ref 3.87–5.11)
RDW: 14.9 % (ref 11.5–15.5)
WBC: 5.9 10*3/uL (ref 4.0–10.5)
nRBC: 0 % (ref 0.0–0.2)

## 2021-04-06 LAB — URINALYSIS, COMPLETE (UACMP) WITH MICROSCOPIC
Bilirubin Urine: NEGATIVE
Glucose, UA: NEGATIVE mg/dL
Hgb urine dipstick: NEGATIVE
Ketones, ur: NEGATIVE mg/dL
Nitrite: POSITIVE — AB
Protein, ur: NEGATIVE mg/dL
Specific Gravity, Urine: 1.019 (ref 1.005–1.030)
WBC, UA: >50 WBC/hpf — ABNORMAL HIGH (ref 0–5)
pH: 5 (ref 5.0–8.0)

## 2021-04-06 LAB — COMPREHENSIVE METABOLIC PANEL
ALT: 11 U/L (ref 0–44)
AST: 12 U/L — ABNORMAL LOW (ref 15–41)
Albumin: 2.7 g/dL — ABNORMAL LOW (ref 3.5–5.0)
Alkaline Phosphatase: 52 U/L (ref 38–126)
Anion gap: 3 — ABNORMAL LOW (ref 5–15)
BUN: 19 mg/dL (ref 8–23)
CO2: 24 mmol/L (ref 22–32)
Calcium: 7.2 mg/dL — ABNORMAL LOW (ref 8.9–10.3)
Chloride: 117 mmol/L — ABNORMAL HIGH (ref 98–111)
Creatinine, Ser: 0.54 mg/dL (ref 0.44–1.00)
GFR, Estimated: >60 mL/min (ref >60)
Glucose, Bld: 83 mg/dL (ref 70–99)
Potassium: 3.4 mmol/L — ABNORMAL LOW (ref 3.5–5.1)
Sodium: 144 mmol/L (ref 135–145)
Total Bilirubin: 0.5 mg/dL (ref 0.3–1.2)
Total Protein: 4.7 g/dL — ABNORMAL LOW (ref 6.5–8.1)

## 2021-04-06 LAB — LACTIC ACID, PLASMA: Lactic Acid, Venous: 0.9 mmol/L (ref 0.5–1.9)

## 2021-04-06 LAB — AMMONIA: Ammonia: <9 umol/L — ABNORMAL LOW (ref 9–35)

## 2021-04-06 MED ORDER — ACETAMINOPHEN 650 MG RE SUPP: 650.0000 mg | Freq: Four times a day (QID) | RECTAL | Status: DC | PRN

## 2021-04-06 MED ORDER — ALBUTEROL SULFATE (2.5 MG/3ML) 0.083% IN NEBU: 2.5000 mg | INHALATION_SOLUTION | RESPIRATORY_TRACT | Status: DC | PRN

## 2021-04-06 MED ORDER — SODIUM CHLORIDE 0.9 % IV SOLN
1.0000 g | INTRAVENOUS | Status: DC
  Filled 2021-04-06: qty 10

## 2021-04-06 MED ORDER — ONDANSETRON HCL 4 MG/2ML IJ SOLN: 4.0000 mg | Freq: Four times a day (QID) | INTRAMUSCULAR | Status: DC | PRN

## 2021-04-06 MED ORDER — ENOXAPARIN SODIUM 40 MG/0.4ML ~~LOC~~ SOLN
40.0000 mg | SUBCUTANEOUS | Status: DC
  Administered 2021-04-06 – 2021-04-07 (×2): 40 mg via SUBCUTANEOUS
  Filled 2021-04-06 (×2): qty 0.4

## 2021-04-06 MED ORDER — SODIUM CHLORIDE 0.9 % IV SOLN
500.0000 mg | Freq: Once | INTRAVENOUS | Status: AC
  Administered 2021-04-06: 500 mg via INTRAVENOUS
  Filled 2021-04-06: qty 500

## 2021-04-06 MED ORDER — ACETAMINOPHEN 325 MG PO TABS: 650.0000 mg | ORAL_TABLET | Freq: Four times a day (QID) | ORAL | Status: DC | PRN

## 2021-04-06 MED ORDER — ONDANSETRON HCL 4 MG PO TABS
4.0000 mg | ORAL_TABLET | Freq: Four times a day (QID) | ORAL | Status: DC | PRN
Start: 2021-04-06 — End: 2021-04-08

## 2021-04-06 MED ORDER — POTASSIUM CHLORIDE 2 MEQ/ML IV SOLN
INTRAVENOUS | Status: AC
  Filled 2021-04-06 (×3): qty 1000

## 2021-04-06 MED ORDER — SENNOSIDES-DOCUSATE SODIUM 8.6-50 MG PO TABS: 1.0000 | ORAL_TABLET | Freq: Every evening | ORAL | Status: DC | PRN

## 2021-04-06 MED ORDER — SODIUM CHLORIDE 0.9 % IV SOLN
1.0000 g | Freq: Once | INTRAVENOUS | Status: AC
  Administered 2021-04-06: 1 g via INTRAVENOUS
  Filled 2021-04-06: qty 10

## 2021-04-06 NOTE — ED Notes (Signed)
Continuous cardiac and pulse ox monitoring.  

## 2021-04-06 NOTE — ED Provider Notes (Signed)
[facility] Emergency Department Provider Note  ____________________________________________  Time seen: Approximately 6:16 PM  I have reviewed the triage vital signs and the nursing notes.   HISTORY  Chief Complaint Altered Mental Status  Level 5 caveat: Patient is unable to provide any history.  HPI Heather [name] is a 80 y.o. female who presents the emergency department from the [facility] for complaint of altered mental status.  Patient has been progressively less responsive over the last 3 days.  According to EMS who is the sole historian, there had been no complaints of fevers, chills, emesis, diarrhea.  Possible changes in urination are reported.  Patient has not been eating and drinking as normal, is not responding for her normal self.  According to EMS the facility reached out to the patient's son and the son originally did not want the patient transported as "she has these periods when they change her meds."  History of COPD, hypertension.  EMS reports that vitals were relatively stable, slightly hypothermic.  Patient had not been responding to EMS except for when they started IV.  Otherwise patient will intermittently open eyes does not seem to focus on EMS.  They report that she has been leaning to the left however moving upper and lower extremities equally to the right and left sides.         Past Medical History:  Diagnosis Date  . COPD (chronic obstructive pulmonary disease) (HCC)   . Hypertension   . Macular degeneration     Patient Active Problem List   Diagnosis Date Noted  . COPD (chronic obstructive pulmonary disease) (HCC) 03/21/2018    Past Surgical History:  Procedure Laterality Date  . JOINT REPLACEMENT     Lt hip replacement    Prior to Admission medications   Medication Sig Start Date End Date Taking? Authorizing Provider  acetaminophen (TYLENOL) 325 MG tablet Take 650 mg by mouth every 6 (six) hours as needed.     [provider]  albuterol (PROVENTIL HFA;VENTOLIN HFA) 108 (90 Base) MCG/ACT inhaler Inhale 1 puff into the lungs every 6 (six) hours as needed for wheezing or shortness of breath.    [provider]  calcium carbonate (TUMS - DOSED IN MG ELEMENTAL CALCIUM) 500 MG chewable tablet Chew 1 tablet by mouth every 8 (eight) hours as needed for indigestion or heartburn.    [provider]  docusate sodium (COLACE) 100 MG capsule Take 100 mg by mouth daily.    [provider]  ferrous sulfate 325 (65 FE) MG EC tablet Take 325 mg by mouth every morning.    [provider]  Fluticasone-Salmeterol (ADVAIR) 250-50 MCG/DOSE AEPB Inhale 1 puff into the lungs every 12 (twelve) hours.    [provider]  lidocaine (LIDODERM) 5 % Place 1 patch onto the skin daily. Remove & Discard patch within 12 hours or as directed by MD    [provider]  loratadine (CLARITIN) 5 MG chewable tablet Chew 5 mg by mouth daily.    [provider]  Melatonin 3 MG TABS Take 3 mg by mouth at bedtime.    [provider]  metoprolol tartrate (LOPRESSOR) 25 MG tablet Take 1 tablet (25 mg total) by mouth 2 (two) times daily. 03/22/18   Salary, Evelena Asa, MD  Multiple Vitamin (MULTIVITAMIN WITH MINERALS) TABS tablet Take 1 tablet by mouth every morning.    [provider]  omeprazole (PRILOSEC) 40 MG capsule Take 40 mg by mouth every  morning.    [provider]  oxyCODONE (OXY IR/ROXICODONE) 5 MG immediate release tablet Take 5 mg by mouth every 6 (six) hours as needed for severe pain.    [provider]  Polyethyl Glycol-Propyl Glycol (SYSTANE) 0.4-0.3 % SOLN Place 1 drop into both eyes daily as needed.    [provider]  polyethylene glycol (MIRALAX / GLYCOLAX) packet Take 17 g by mouth daily as needed for mild constipation.     [provider]  pregabalin (LYRICA) 150 MG capsule Take 150 mg by mouth every 12 (twelve) hours.     [provider]  QUEtiapine (SEROQUEL) 25 MG tablet Take 25 mg by mouth at bedtime.    [provider]  sertraline (ZOLOFT) 50 MG tablet Take 50 mg by mouth daily.    [provider]    Allergies Patient has no known allergies.  No family history on file.  Social History Social History   Tobacco Use  . Smoking status: Former Games developer  . Smokeless tobacco: Never Used  Vaping Use  . Vaping Use: Never used  Substance Use Topics  . Alcohol use: Not Currently  . Drug use: Not Currently     Review of Systems   Review of systems provided by EMS.  According to EMS, progressive altered mental status x3 days.  Not responding to stimuli other than painful.  Occasionally will open her eyes on her own.  Leaning to the left, but does not appear to be unilaterally weak.  Facility had reported urinary habit changes.  No other significant reports of fevers, chills, URI symptoms, cough, shortness of breath, emesis, diarrhea or constipation.   10 System ROS otherwise negative.  ____________________________________________   PHYSICAL EXAM:  VITAL SIGNS: ED Triage Vitals  Enc Vitals Group     BP      Pulse      Resp      Temp      Temp src      SpO2      Weight      Height      Head Circumference      Peak Flow      Pain Score      Pain Loc      Pain Edu?      Excl. in GC?      Constitutional: GCS 9 (4, 1, 4).  Patient is not alert.  Patient will open eyes spontaneously intermittently.  Patient is nonverbal at this time.  Withdraws from pain. Eyes: Conjunctivae are normal. PERRL. EOMI. Head: Atraumatic. ENT:      Ears:       Nose: Significant congestion/rhinnorhea.      Mouth/Throat: Mucous membranes are dry.  No angioedema.  No oropharyngeal erythema. Neck: No stridor.   Hematological/Lymphatic/Immunilogical: No cervical lymphadenopathy. Cardiovascular: Normal rate, regular rhythm. Normal S1 and S2.  Good peripheral circulation. Respiratory:  Normal respiratory effort without tachypnea or retractions. Lungs CTAB. Good air entry to the bases with no decreased or absent breath sounds. Gastrointestinal: Bowel sounds 4 quadrants. Soft to palpation.. No guarding or rigidity. No palpable masses. No distention. No CVA tenderness. Musculoskeletal: Patient moving all extremities spontaneously at this time.  Will respond to painful stimuli.  No appreciable signs of injury Neurologic: Altered mental status.  Patient is not verbal at this time.  Withdraws from pain.  Unable to follow neuro testing. Skin:  Skin is warm, dry and intact. No rash noted. Psychiatric: Altered mental status   ____________________________________________  LABS (all labs ordered are listed, but only abnormal results are displayed)  Labs Reviewed  CBC WITH DIFFERENTIAL/PLATELET - Abnormal; Notable for the following components:      Result Value   RBC 3.85 (*)    Hemoglobin 10.6 (*)    HCT 34.6 (*)    All other components within normal limits  COMPREHENSIVE METABOLIC PANEL - Abnormal; Notable for the following components:   Potassium 3.4 (*)    Chloride 117 (*)    Calcium 7.2 (*)    Total Protein 4.7 (*)    Albumin 2.7 (*)    AST 12 (*)    Anion gap 3 (*)    All other components within normal limits  AMMONIA - Abnormal; Notable for the following components:   Ammonia <9 (*)    All other components within normal limits  URINALYSIS, COMPLETE (UACMP) WITH MICROSCOPIC - Abnormal; Notable for the following components:   Color, Urine YELLOW (*)    APPearance CLOUDY (*)    Nitrite POSITIVE (*)    Leukocytes,Ua LARGE (*)    WBC, UA >50 (*)    Bacteria, UA MANY (*)    Non Squamous Epithelial PRESENT (*)    All other components within normal limits  SARS CORONAVIRUS 2 (TAT 6-24 HRS)  LACTIC ACID, PLASMA  LACTIC ACID, PLASMA  CBG MONITORING, ED    ____________________________________________  EKG   ____________________________________________  RADIOLOGY I personally viewed and evaluated these images as part of my medical decision making, as well as reviewing the written report by the radiologist.  ED Provider Interpretation: No acute intracranial abnormality.  Consolidation on chest   DG Chest 2 View  Result Date: 04/06/2021 CLINICAL DATA:  Altered level of consciousness, disorientation EXAM: CHEST - 2 VIEW COMPARISON:  10/23/2020 FINDINGS: Frontal and lateral views of the chest are obtained. Cardiac silhouette is enlarged but stable. Dense calcification of the mitral annulus. Moderate hiatal hernia again noted. Lungs are hyperinflated. There is patchy consolidation within the left lower lobe, with blunting of the costophrenic angle. No pneumothorax. IMPRESSION: 1. Left lower lobe consolidation which may reflect airspace disease or atelectasis. 2. Small left pleural effusion. 3. Hiatal hernia. Electronically Signed   By: Sharlet SalinaMichael  Brown M.D.   On: 04/06/2021 19:09   CT Head Wo Contrast  Result Date: 04/06/2021 CLINICAL DATA:  Mental status change EXAM: CT HEAD WITHOUT CONTRAST TECHNIQUE: Contiguous axial images were obtained from the base of the skull through the vertex without intravenous contrast. COMPARISON:  02/27/2021 FINDINGS: Brain: No evidence of acute infarction, hemorrhage, hydrocephalus, extra-axial collection or mass lesion/mass effect. Periventricular and deep white matter hypodensity with encephalomalacia of the right greater than left frontal poles, appearance and configuration unchanged. Vascular: No hyperdense vessel or unexpected calcification. Skull: Normal. Negative for fracture or focal lesion. Sinuses/Orbits: No acute finding. Other: None. IMPRESSION: No acute intracranial pathology. 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 Electronically Signed   By: Lauralyn PrimesAlex  Bibbey M.D.   On: 04/06/2021 18:54    ____________________________________________    PROCEDURES  Procedure(s) performed:    Procedures    Medications  cefTRIAXone (ROCEPHIN) 1 g in sodium chloride 0.9 % 100 mL IVPB (has no administration in time range)  azithromycin (ZITHROMAX) 500 mg in sodium chloride 0.9 % 250 mL IVPB (has no administration in time range)     ____________________________________________   INITIAL IMPRESSION / ASSESSMENT AND PLAN / ED COURSE  Pertinent labs & imaging results that were available during my care of the patient were reviewed by me and considered in my medical decision making (see chart for details).  Review of the [place] CSRS was performed in accordance of the [facility] prior to dispensing any controlled drugs.        Patient presented to the emergency department via EMS from long-term care facility for altered mental status.  Patient has had a decline in the last 3 days.  Initially the son refused transport as he states that the patient will have the spells when "her medicines are changing."  Patient was nonverbal here, eyes did open spontaneously and she did withdraw to pain.  She was overall noncontributory towards exam.  There was copious nasal congestion but otherwise physical exam was relatively reassuring with the exception of altered mental status.  Labs, imaging are ordered at this time.  Prior to return of the results, patient care will be handed over to attending provider, Dr. Scotty Court for final diagnosis and disposition.  Differential includes viral URI, pneumonia, urinary tract infection, CVA, ACS/STEMI.  Based off of presentation patient will likely require admission.    This chart was dictated using voice recognition software/Dragon. Despite best efforts to proofread, errors can occur which can change the meaning. Any change was purely unintentional.    Racheal Patches, PA-C 04/06/21 2015    Sharman Cheek, MD 04/06/21 2035

## 2021-04-06 NOTE — ED Triage Notes (Signed)
Pt in via [facility] from [facility]; per EMS report, patient becoming less responsive over the last three days, currently alert but disoriented x 4 upon arrival.  States patient is A/O, ambulatory at baseline.

## 2021-04-06 NOTE — H&P (Signed)
History and Physical    Heather [name] EHU:[id number] DOB: 1941/02/25 DOA: 04/06/2021  PCP: Orvis [facility], [facility]  Patient coming from: [facility]  I have personally briefly reviewed patient's old medical records in [place]  Chief Complaint: Altered mental status  HPI: Heather [name] is a 80 y.o. 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  Patient is unable to provide any history due to altered mental status therefore history is otherwise obtained from EDP, chart review, and her son Hessie Knows by phone.  Her son states that patient recently moved from [facility] to [facility] facility.  He suspects that she did not like the change in environment.  He has noticed over the last few days she has been much less verbally interactive and seems very lethargic.  He says she is normally communicative at her baseline but does demonstrate memory issues as indicates that she had recent conversations with deceased family members.  She is largely wheelchair-bound at her baseline but does on occasion ambulate with the use of a walker.  There seems to be some motivational component to her level of activity.  He says that she has had a significant functional decline since her traumatic brain injury several years ago.  At time of admission, patient is very somnolent but will briefly awaken and follow simple commands such as left arm or open mouth.  She answers "no" when asked if she is having any pain but otherwise is not answering other questions.  ED Course:  Initial vitals showed BP 143/69, pulse 53, RR 20, temp not recorded, SPO2 93% on room air.  Labs show WBC 5.9, hemoglobin 10.6, platelets 179,000, sodium 144, potassium  3.4, bicarb 24, BUN 19, creatinine 0.54, serum glucose 83, calcium 7.2, albumin 2.7, total protein 4.7, AST 12, ALT 11, alk phos 52, total bilirubin 0.5, ammonia <9, lactic acid 0.9.  Urinalysis shows positive nitrites, large leukocytes, 21-50 RBC/hpf, >50 WBC/hpf, many bacteria on microscopy.  SARS-CoV-2 PCR is obtained and pending.  CT head without contrast is negative for acute intracranial pathology.  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  2 view chest x-ray shows left lower lobe consolidation, airspace disease versus atelectasis.  Small left pleural effusion noted.  Patient was given IV ceftriaxone and azithromycin.  The hospitalist service was consulted to admit for further evaluation and management.  Review of Systems:  Unable to obtain full review of systems due to patient's altered mental status.   Past Medical History:  Diagnosis Date  . COPD (chronic obstructive pulmonary disease) ([place])   . Hypertension   . Macular degeneration     Past Surgical History:  Procedure Laterality Date  . JOINT REPLACEMENT     Lt hip replacement    Social History:  reports that she has quit smoking. She has never used smokeless tobacco. She reports previous alcohol use. She reports previous drug use.  No Known Allergies  Family History  Problem Relation Age of Onset  . Diabetes Mother   . Hypertension Father      Prior to Admission medications   Medication Sig Start Date End Date Taking? Authorizing Provider  acetaminophen (TYLENOL) 325 MG tablet Take 650 mg by mouth every  6 (six) hours as needed.     [provider]  albuterol (PROVENTIL HFA;VENTOLIN HFA) 108 (90 Base) MCG/ACT inhaler Inhale 1 puff into the lungs every 6 (six) hours as needed for wheezing or shortness of breath.    [provider]  calcium carbonate (TUMS - DOSED IN MG ELEMENTAL CALCIUM) 500 MG  chewable tablet Chew 1 tablet by mouth every 8 (eight) hours as needed for indigestion or heartburn.    [provider]  docusate sodium (COLACE) 100 MG capsule Take 100 mg by mouth daily.    [provider]  ferrous sulfate 325 (65 FE) MG EC tablet Take 325 mg by mouth every morning.    [provider]  Fluticasone-Salmeterol (ADVAIR) 250-50 MCG/DOSE AEPB Inhale 1 puff into the lungs every 12 (twelve) hours.    [provider]  lidocaine (LIDODERM) 5 % Place 1 patch onto the skin daily. Remove & Discard patch within 12 hours or as directed by MD    [provider]  loratadine (CLARITIN) 5 MG chewable tablet Chew 5 mg by mouth daily.    [provider]  Melatonin 3 MG TABS Take 3 mg by mouth at bedtime.    [provider]  metoprolol tartrate (LOPRESSOR) 25 MG tablet Take 1 tablet (25 mg total) by mouth 2 (two) times daily. 03/22/18   Salary, Avel Peace, MD  Multiple Vitamin (MULTIVITAMIN WITH MINERALS) TABS tablet Take 1 tablet by mouth every morning.    [provider]  omeprazole (PRILOSEC) 40 MG capsule Take 40 mg by mouth every morning.    [provider]  oxyCODONE (OXY IR/ROXICODONE) 5 MG immediate release tablet Take 5 mg by mouth every 6 (six) hours as needed for severe pain.    [provider]  Polyethyl Glycol-Propyl Glycol (SYSTANE) 0.4-0.3 % SOLN Place 1 drop into both eyes daily as needed.    [provider]  polyethylene glycol (MIRALAX / GLYCOLAX) packet Take 17 g by mouth daily as needed for mild constipation.     [provider]  pregabalin (LYRICA) 150 MG capsule Take 150 mg by mouth every 12 (twelve) hours.    [provider]  QUEtiapine (SEROQUEL) 25 MG tablet Take 25 mg by mouth at bedtime.    [provider]  sertraline (ZOLOFT) 50 MG tablet Take 50 mg by mouth daily.    [provider]    Physical Exam: Vitals:   04/06/21 1918 04/06/21 2030   BP: 137/70 (!) 128/46  Pulse: (!) 54 (!) 51  Resp: 17 18  SpO2: 100% 100%   Exam limited due to patient's altered mental status. Constitutional: Chronically ill elderly woman resting supine in bed with head slightly elevated.  Somnolent but appears calm, comfortable Eyes: PERRL, lids and conjunctivae normal ENMT: Mucous membranes are dry. Posterior pharynx clear of any exudate or lesions. Neck: normal, supple, no masses. Respiratory: clear to auscultation anteriorly.  Normal respiratory effort. No accessory muscle use.  Cardiovascular: Bradycardic with regular rhythm, no murmurs / rubs / gallops. No extremity edema. 2+ pedal pulses. Abdomen: no obvious tenderness, no masses palpated. No hepatosplenomegaly. Bowel sounds positive.  Musculoskeletal: no clubbing / cyanosis. No joint deformity upper and lower extremities.  Contracture left wrist.  Unable to assess full range of motion due to cooperation, will left both arms off bed but neither lower extremity. Skin: no rashes, lesions, ulcers. No induration Neurologic: Limited exam, sensation appears intact. Strength difficult to assess due to cooperation, appears  intact both upper extremities but does not move the lower extremities to command. Psychiatric: Somnolent, will briefly awaken to follow simple commands such as left arm or open mouth but otherwise not verbally or physically interactive.  Labs on Admission: I have personally reviewed following labs and imaging studies  CBC: Recent Labs  Lab 04/06/21 1910  WBC 5.9  NEUTROABS 3.1  HGB 10.6*  HCT 34.6*  MCV 89.9  PLT 449   Basic Metabolic Panel: Recent Labs  Lab 04/06/21 1910  NA 144  K 3.4*  CL 117*  CO2 24  GLUCOSE 83  BUN 19  CREATININE 0.54  CALCIUM 7.2*   GFR: CrCl cannot be calculated (Unknown ideal weight.). Liver Function Tests: Recent Labs  Lab 04/06/21 1910  AST 12*  ALT 11  ALKPHOS 52  BILITOT 0.5  PROT 4.7*  ALBUMIN 2.7*   No results for  input(s): LIPASE, AMYLASE in the last 168 hours. Recent Labs  Lab 04/06/21 1910  AMMONIA <9*   Coagulation Profile: No results for input(s): INR, PROTIME in the last 168 hours. Cardiac Enzymes: No results for input(s): CKTOTAL, CKMB, CKMBINDEX, TROPONINI in the last 168 hours. BNP (last 3 results) No results for input(s): PROBNP in the last 8760 hours. HbA1C: No results for input(s): HGBA1C in the last 72 hours. CBG: No results for input(s): GLUCAP in the last 168 hours. Lipid Profile: No results for input(s): CHOL, HDL, LDLCALC, TRIG, CHOLHDL, LDLDIRECT in the last 72 hours. Thyroid Function Tests: No results for input(s): TSH, T4TOTAL, FREET4, T3FREE, THYROIDAB in the last 72 hours. Anemia Panel: No results for input(s): VITAMINB12, FOLATE, FERRITIN, TIBC, IRON, RETICCTPCT in the last 72 hours. Urine analysis:    Component Value Date/Time   COLORURINE YELLOW (A) 04/06/2021 1918   APPEARANCEUR CLOUDY (A) 04/06/2021 1918   LABSPEC 1.019 04/06/2021 1918   PHURINE 5.0 04/06/2021 1918   GLUCOSEU NEGATIVE 04/06/2021 1918   HGBUR NEGATIVE 04/06/2021 1918   BILIRUBINUR NEGATIVE 04/06/2021 1918   KETONESUR NEGATIVE 04/06/2021 1918   PROTEINUR NEGATIVE 04/06/2021 1918   NITRITE POSITIVE (A) 04/06/2021 1918   LEUKOCYTESUR LARGE (A) 04/06/2021 1918    Radiological Exams on Admission: DG Chest 2 View  Result Date: 04/06/2021 CLINICAL DATA:  Altered level of consciousness, disorientation EXAM: CHEST - 2 VIEW COMPARISON:  10/23/2020 FINDINGS: Frontal and lateral views of the chest are obtained. Cardiac silhouette is enlarged but stable. Dense calcification of the mitral annulus. Moderate hiatal hernia again noted. Lungs are hyperinflated. There is patchy consolidation within the left lower lobe, with blunting of the costophrenic angle. No pneumothorax. IMPRESSION: 1. Left lower lobe consolidation which may reflect airspace disease or atelectasis. 2. Small left pleural effusion. 3. Hiatal  hernia. Electronically Signed   By: Randa Ngo M.D.   On: 04/06/2021 19:09   CT Head Wo Contrast  Result Date: 04/06/2021 CLINICAL DATA:  Mental status change EXAM: CT HEAD WITHOUT CONTRAST TECHNIQUE: Contiguous axial images were obtained from the base of the skull through the vertex without intravenous contrast. COMPARISON:  02/27/2021 FINDINGS: Brain: No evidence of acute infarction, hemorrhage, hydrocephalus, extra-axial collection or mass lesion/mass effect. Periventricular and deep white matter hypodensity with encephalomalacia of the right greater than left frontal poles, appearance and configuration unchanged. Vascular: No hyperdense vessel or unexpected calcification. Skull: Normal. Negative for fracture or focal lesion. Sinuses/Orbits: No acute finding. Other: None. IMPRESSION: No acute intracranial pathology. Advanced small-vessel white matter disease and encephalomalacia of the right greater than left frontal poles, appearance and configuration  unchanged from prior. Electronically Signed   By: Eddie Candle M.D.   On: 04/06/2021 18:54    EKG: Ordered and pending.  Assessment/Plan Principal Problem:   Acute metabolic encephalopathy Active Problems:   COPD (chronic obstructive pulmonary disease) ([place])   Hypertension   Acute lower UTI   Casara Perrier is a 80 y.o. 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  Acute metabolic encephalopathy: Suspect multifactorial from suspected UTI, dehydration, and possible medication effect.  Normally communicative but very somnolent and nearly nonverbal at time of admission.  CT head without acute changes. -Continue empiric IV ceftriaxone -Start IV fluid hydration with LR overnight -Hold all sedating medications for now  UTI: Continue  empiric IV ceftriaxone.  Add on urine culture.  Left lower lobe lung consolidation: Seen on CXR.  Suspect atelectasis rather than pneumonia as she is afebrile, without leukocytosis, or obvious respiratory symptoms.  Will hold further azithromycin.  Start incentive spirometer when more awake and interactive.  Sinus bradycardia: Borderline with heart rate maintaining in the 50s on admission.  Continue to monitor.  COPD: Per son, wears 2.5-3 L supplemental O2 via [place] at all times.  Currently saturating well on room air.  No acute issues.  Use supplemental oxygen and albuterol as needed.  Hypertension: Normotensive, home meds on hold for now.  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  Per son, patient exhibits signs of cognitive impairment/dementia and occasional agitated behavior.  Home meds on hold for now.  Continue delirium and fall precautions.  Chronic back pain and anxiety: Home meds currently on hold due to excessive somnolence.  DVT prophylaxis: Lovenox Code Status: DNR, confirmed with patient's son on admission Family Communication: Discussed with patient's son Hessie Knows Disposition Plan: From [facility], likely return to same facility versus SNF on discharge Consults called: None Level of care: Med-Surg Admission status:  Status is: Observation  The patient remains OBS appropriate and will d/c before 2 midnights.  Dispo: The patient is from: ALF              Anticipated d/c is to: ALF or SNF              Patient currently is not medically stable to d/c.    Zada Finders MD [facility]  If 7PM-7AM, please contact night-coverage [email or web address]  04/06/2021, 8:42 PM

## 2021-04-07 DIAGNOSIS — G9341 Metabolic encephalopathy: Secondary | ICD-10-CM

## 2021-04-07 DIAGNOSIS — N390 Urinary tract infection, site not specified: Secondary | ICD-10-CM

## 2021-04-07 DIAGNOSIS — Z515 Encounter for palliative care: Secondary | ICD-10-CM

## 2021-04-07 LAB — BASIC METABOLIC PANEL
Anion gap: 12 (ref 5–15)
BUN: 16 mg/dL (ref 8–23)
CO2: 28 mmol/L (ref 22–32)
Calcium: 8.1 mg/dL — ABNORMAL LOW (ref 8.9–10.3)
Chloride: 105 mmol/L (ref 98–111)
Creatinine, Ser: 0.6 mg/dL (ref 0.44–1.00)
GFR, Estimated: >60 mL/min (ref >60)
Glucose, Bld: 95 mg/dL (ref 70–99)
Potassium: 3.2 mmol/L — ABNORMAL LOW (ref 3.5–5.1)
Sodium: 145 mmol/L (ref 135–145)

## 2021-04-07 LAB — CBC
HCT: 37.7 % (ref 36.0–46.0)
Hemoglobin: 11.6 g/dL — ABNORMAL LOW (ref 12.0–15.0)
MCH: 28.3 pg (ref 26.0–34.0)
MCHC: 30.8 g/dL (ref 30.0–36.0)
MCV: 92 fL (ref 80.0–100.0)
Platelets: 170 10*3/uL (ref 150–400)
RBC: 4.1 MIL/uL (ref 3.87–5.11)
RDW: 14.9 % (ref 11.5–15.5)
WBC: 6.2 10*3/uL (ref 4.0–10.5)
nRBC: 0 % (ref 0.0–0.2)

## 2021-04-07 LAB — MRSA PCR SCREENING: MRSA by PCR: NEGATIVE

## 2021-04-07 LAB — MAGNESIUM: Magnesium: 2 mg/dL (ref 1.7–2.4)

## 2021-04-07 LAB — SARS CORONAVIRUS 2 (TAT 6-24 HRS): SARS Coronavirus 2: NEGATIVE

## 2021-04-07 LAB — PHOSPHORUS: Phosphorus: 2 mg/dL — ABNORMAL LOW (ref 2.5–4.6)

## 2021-04-07 MED ORDER — POTASSIUM & SODIUM PHOSPHATES 280-160-250 MG PO PACK
2.0000 | PACK | Freq: Three times a day (TID) | ORAL | Status: DC
  Administered 2021-04-07 – 2021-04-08 (×4): 2 via ORAL
  Filled 2021-04-07 (×8): qty 2

## 2021-04-07 MED ORDER — POTASSIUM CHLORIDE 20 MEQ PO PACK
40.0000 meq | PACK | ORAL | Status: AC
  Administered 2021-04-07 (×2): 40 meq via ORAL
  Filled 2021-04-07 (×2): qty 2

## 2021-04-07 NOTE — Progress Notes (Signed)
PROGRESS NOTE    Heather [name]  TZG:[id number] DOB: 1941-04-03 DOA: 04/06/2021 PCP: [facility]  120A/120A-AA   Assessment & Plan:   Principal Problem:   Acute metabolic encephalopathy Active Problems:   COPD (chronic obstructive pulmonary disease) ([facility])   Hypertension   Urinary tract infection without hematuria   Palliative care by specialist   Heather [name] is a 80 y.o. 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  Her son states that patient recently moved from [facility] nursing facility to [facility].  He suspects that she did not like the change in environment.  He has noticed over the last few days she has been much less verbally interactive and seems very lethargic.  He says she is normally communicative at her baseline but does demonstrate memory issues as indicates that she had recent conversations with deceased family members.  She is largely wheelchair-bound at her baseline but does on occasion ambulate with the use of a walker.  There seems to be some motivational component to her level of activity.  He says that she has had a significant functional decline since her traumatic brain injury several years ago.   Acute metabolic encephalopathy Delirium Baseline dementia  Suspect multifactorial from suspected UTI, dehydration, and sedating medication effect.  May have delirium due to change in environment.  Also may be progressive decline in the setting of dementia, brain trauma, old age.  --Son noted more somnolence and less oral intake. --CT head without acute changes. Plan: --palliative care consult --Hold all sedating medications for now  UTI, rule out --UA with bacteria which is not unusual in an elderly  female.   --No signs of sepsis.  Son reported pt had not complained of urinary symptoms or abdominal pain. Plan: --d/c ceftriaxone --f/u urine cx  Left lower lobe lung consolidation likely due to atelectasis Seen on CXR.  Suspect atelectasis rather than pneumonia as she is afebrile, without leukocytosis, or obvious respiratory symptoms.   --hold further abx --Start incentive spirometer when more awake and interactive.  Sinus bradycardia: Borderline with heart rate maintaining in the 50s on admission.   --hold home metop for now  COPD  Chronic hypoxic respiratory failure on 2-3L O2 Per son, wears 2.5-3 L supplemental O2 via [place] at all times.  Currently saturating well on room air.  No acute issues.   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  Per son, patient exhibits signs of cognitive impairment/dementia and occasional agitated behavior.   --Continue delirium and fall precautions.  Chronic back pain  --home tramadol hold 2/2 somnolence  Anxiety and agitation --Home Ativan and olanzapine hold 2/2 somnolence   DVT prophylaxis: Lovenox SQ Code Status: DNR  Family Communication: son updated on the phone today Level of care: Med-Surg Dispo:   The patient is from: ALF Anticipated d/c is to: undetermined Anticipated d/c date is: 2-3 days Patient currently is not medically ready to d/c due to: extreme somnolence, need to figure out disposition and goals of care   Subjective and Interval History:  Pt opened her eyes to voice, but not responding to questions.   Per son, pt was  awake and talking with him earlier this morning.   Objective: Vitals:   04/07/21 0543 04/07/21 0739 04/07/21 1249 04/07/21 1707  BP: (!) 134/53 (!) 157/69 (!) 152/62 (!)  159/64  Pulse: (!) 50 62 62 72  Resp: 16 14 16 15   Temp:  97.6 F (36.4 C) 98.9 F (37.2 C) 98.4 F (36.9 C)  TempSrc:      SpO2: 100% 100% 100% 94%  Weight:        Intake/Output Summary (Last 24 hours) at 04/07/2021 1821 Last data filed at 04/07/2021 0500 Gross per 24 hour  Intake 823.83 ml  Output 300 ml  Net 523.83 ml   Filed Weights   04/06/21 2217  Weight: 69 kg    Examination:   Constitutional: NAD, sleeping but arousable, not responding to questions HEENT: conjunctivae and lids normal, EOMI CV: No cyanosis.   RESP: normal respiratory effort, on RA Extremities: No effusions, edema in BLE SKIN: warm, dry   Data Reviewed: I have personally reviewed following labs and imaging studies  CBC: Recent Labs  Lab 04/06/21 1910 04/07/21 0703  WBC 5.9 6.2  NEUTROABS 3.1  --   HGB 10.6* 11.6*  HCT 34.6* 37.7  MCV 89.9 92.0  PLT 179 170   Basic Metabolic Panel: Recent Labs  Lab 04/06/21 1910 04/07/21 0523  NA 144 145  K 3.4* 3.2*  CL 117* 105  CO2 24 28  GLUCOSE 83 95  BUN 19 16  CREATININE 0.54 0.60  CALCIUM 7.2* 8.1*  MG  --  2.0  PHOS  --  2.0*   GFR: Estimated Creatinine Clearance: 46.6 mL/min (by C-G formula based on SCr of 0.6 mg/dL). Liver Function Tests: Recent Labs  Lab 04/06/21 1910  AST 12*  ALT 11  ALKPHOS 52  BILITOT 0.5  PROT 4.7*  ALBUMIN 2.7*   No results for input(s): LIPASE, AMYLASE in the last 168 hours. Recent Labs  Lab 04/06/21 1910  AMMONIA <9*   Coagulation Profile: No results for input(s): INR, PROTIME in the last 168 hours. Cardiac Enzymes: No results for input(s): CKTOTAL, CKMB, CKMBINDEX, TROPONINI in the last 168 hours. BNP (last 3 results) No results for input(s): PROBNP in the last 8760 hours. HbA1C: No results for input(s): HGBA1C in the last 72 hours. CBG: No results for input(s): GLUCAP in the last 168 hours. Lipid Profile: No results for input(s): CHOL, HDL, LDLCALC, TRIG, CHOLHDL, LDLDIRECT in the  last 72 hours. Thyroid Function Tests: No results for input(s): TSH, T4TOTAL, FREET4, T3FREE, THYROIDAB in the last 72 hours. Anemia Panel: No results for input(s): VITAMINB12, FOLATE, FERRITIN, TIBC, IRON, RETICCTPCT in the last 72 hours. Sepsis Labs: Recent Labs  Lab 04/06/21 1910  LATICACIDVEN 0.9    Recent Results (from the past 240 hour(s))  SARS CORONAVIRUS 2 (TAT 6-24 HRS) Nasopharyngeal Nasopharyngeal Swab     Status: None   Collection Time: 04/06/21  7:18 PM   Specimen: Nasopharyngeal Swab  Result Value Ref Range Status   SARS Coronavirus 2 NEGATIVE NEGATIVE Final    Comment: (NOTE) SARS-CoV-2 target nucleic acids are NOT DETECTED.  The SARS-CoV-2 RNA is generally detectable in upper and lower respiratory specimens during the acute phase of infection. Negative results do not preclude SARS-CoV-2 infection, do not rule out co-infections with other pathogens, and should not be used as the sole basis for treatment or other patient management decisions. Negative results must be combined with clinical observations, patient history, and epidemiological information. The expected result is Negative.  Fact Sheet for Patients: HairSlick.no  Fact Sheet for Healthcare Providers: quierodirigir.com  This test is not yet approved or cleared by the [place] FDA and  has been authorized for detection and/or diagnosis of SARS-CoV-2 by FDA under an Emergency Use Authorization (EUA). This EUA will remain  in effect (meaning this test can be used) for the duration of the COVID-19 declaration under Se ction 564(b)(1) of the Act, 21 U.S.C. section 360bbb-3(b)(1), unless the authorization is terminated or revoked sooner.  Performed at [facility], [address] [place], [place] [address]   MRSA PCR Screening     Status: None   Collection Time: 04/06/21 11:15 PM   Specimen: Nasopharyngeal  Result Value Ref Range  Status   MRSA by PCR NEGATIVE NEGATIVE Final    Comment:        The GeneXpert MRSA Assay (FDA approved for NASAL specimens only), is one component of a comprehensive MRSA colonization surveillance program. It is not intended to diagnose MRSA infection nor to guide or monitor treatment for MRSA infections. Performed at [facility], [address] [place], [place] [address]       Radiology Studies: DG Chest 2 View  Result Date: 04/06/2021 CLINICAL DATA:  Altered level of consciousness, disorientation EXAM: CHEST - 2 VIEW COMPARISON:  10/23/2020 FINDINGS: Frontal and lateral views of the chest are obtained. Cardiac silhouette is enlarged but stable. Dense calcification of the mitral annulus. Moderate hiatal hernia again noted. Lungs are hyperinflated. There is patchy consolidation within the left lower lobe, with blunting of the costophrenic angle. No pneumothorax. IMPRESSION: 1. Left lower lobe consolidation which may reflect airspace disease or atelectasis. 2. Small left pleural effusion. 3. Hiatal hernia. Electronically Signed   By: Sharlet Salina M.D.   On: 04/06/2021 19:09   CT Head Wo Contrast  Result Date: 04/06/2021 CLINICAL DATA:  Mental status change EXAM: CT HEAD WITHOUT CONTRAST TECHNIQUE: Contiguous axial images were obtained from the base of the skull through the vertex without intravenous contrast. COMPARISON:  02/27/2021 FINDINGS: Brain: No evidence of acute infarction, hemorrhage, hydrocephalus, extra-axial collection or mass lesion/mass effect. Periventricular and deep white matter hypodensity with encephalomalacia of the right greater than left frontal poles, appearance and configuration unchanged. Vascular: No hyperdense vessel or unexpected calcification. Skull: Normal. Negative for fracture or focal lesion. Sinuses/Orbits: No acute finding. Other: None. IMPRESSION: No acute intracranial pathology. Advanced small-vessel white matter disease and encephalomalacia  of the right greater than left frontal poles, appearance and configuration unchanged from prior. Electronically Signed   By: Lauralyn Primes M.D.   On: 04/06/2021 18:54     Scheduled Meds: . enoxaparin (LOVENOX) injection  40 mg Subcutaneous Q24H  . potassium & sodium phosphates  2 packet Oral TID WC & HS   Continuous Infusions:   LOS: 0 days     Darlin Priestly, MD [facility] If 7PM-7AM, please contact night-coverage 04/07/2021, 6:21 PM

## 2021-04-07 NOTE — Evaluation (Signed)
Physical Therapy Evaluation Patient Details Name: Heather [name] MRN: [id number] DOB: [birth year] Today's Date: 04/07/2021   History of Present Illness  Pt is a 80 y.o. 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    Clinical Impression  Pt easily woken at start of session, with more stimulation in the environment (TV, lights on, PT speaking with her) did wake. Pt unable to provide PLOF; oriented to self only. PT did call pt son (and assist pt to speak with him), and ALF called as well. At baseline pt needs significant assistance with ADLs, has vision deficits that impair her ability to feed herself so often needs supervision/setup assistance. Family also reported several instances of pt "sliding" out of chairs, bed, lift chair to floor. [facility] reported pt is WC bound with 2 person assist at baseline.  Pt performed supine to sit with modA, able to sit EOB for several minutes, intermittent posterior lean noted, cga-minA to maintain balance. She was able to sip apple juice and hold cup with minA for balance, and PT handing her the cup. Sit <> stand attempted three ways, maxA and pt unable to come up fully into standing. Returned to supine minA. The patient demonstrated and reported return to baseline level of functioning, no further acute PT needs indicated. PT to sign off. Please reconsult PT if pt status changes or acute needs are identified.      Follow Up Recommendations No PT follow up    Equipment Recommendations  None recommended by PT    Recommendations for Other Services       Precautions / Restrictions Precautions Precautions: Fall Restrictions Weight Bearing Restrictions: No      Mobility  Bed Mobility Overal bed mobility: Needs Assistance Bed  Mobility: Supine to Sit;Sit to Supine     Supine to sit: Max assist;HOB elevated Sit to supine: Min assist;HOB elevated        Transfers Overall transfer level: Needs assistance Equipment used: 1 person hand held assist;None Transfers: Sit to/from Stand Sit to Stand: Max assist         General transfer comment: attempted three times with different techniques including pulling up on sink, pt unable to come up into fully standing, posterior lean noted  Ambulation/Gait             General Gait Details: unable  Stairs            Wheelchair Mobility    Modified Rankin (Stroke Patients Only)       Balance Overall balance assessment: Needs assistance Sitting-balance support: Feet supported;Bilateral upper extremity supported Sitting balance-Leahy Scale: Poor Sitting balance - Comments: intermittently CGA, occasional minA for posterior lean Postural control: Posterior lean     Standing balance comment: unable                             Pertinent Vitals/Pain Pain Assessment: Faces Faces Pain Scale: No hurt    Home Living Family/patient expects to be discharged to:: Assisted living                      Prior Function Level of Independence: Needs assistance   Gait / Transfers Assistance Needed: pt son pt needs varying levels of assistance for mobility, from a little bit to  total assist. Per ALF pt is WC with 2 person assist at baseline  ADL's / Homemaking Assistance Needed: needs significant assistance due to vision impairments/cognitive impairment, can feed herself but has limitations due to eye sight  Comments: her son stated she has had many incidences prior to switching ALFs of sliding of bed/chairs/wc to the floor     Hand Dominance        Extremity/Trunk Assessment   Upper Extremity Assessment Upper Extremity Assessment: Difficult to assess due to impaired cognition;Generalized weakness    Lower Extremity  Assessment Lower Extremity Assessment: Difficult to assess due to impaired cognition;Generalized weakness    Cervical / Trunk Assessment Cervical / Trunk Assessment: Kyphotic  Communication   Communication: No difficulties  Cognition Arousal/Alertness: Awake/alert Behavior During Therapy: WFL for tasks assessed/performed Overall Cognitive Status: History of cognitive impairments - at baseline                                 General Comments: pt oriented to self only      General Comments      Exercises Other Exercises Other Exercises: PT spoke with family (son), and helped pt speak with family as well. Other Exercises: assisted with sips of apple juice sitting EOB, pt able to hold cup, minA for balance   Assessment/Plan    PT Assessment Patent does not need any further PT services  PT Problem List Decreased strength;Decreased balance;Decreased mobility       PT Treatment Interventions      PT Goals (Current goals can be found in the Care Plan section)       Frequency     Barriers to discharge        Co-evaluation               AM-PAC PT "6 Clicks" Mobility  Outcome Measure Help needed turning from your back to your side while in a flat bed without using bedrails?: A Lot Help needed moving from lying on your back to sitting on the side of a flat bed without using bedrails?: A Lot Help needed moving to and from a bed to a chair (including a wheelchair)?: A Lot Help needed standing up from a chair using your arms (e.g., wheelchair or bedside chair)?: Total Help needed to walk in hospital room?: Total Help needed climbing 3-5 steps with a railing? : Total 6 Click Score: 9    End of Session Equipment Utilized During Treatment: Gait belt Activity Tolerance: Patient tolerated treatment well Patient left: in bed;with call bell/phone within reach;with bed alarm set Nurse Communication: Mobility status PT Visit Diagnosis: Other abnormalities of  gait and mobility (R26.89);Unsteadiness on feet (R26.81)    Time: 1660-6301 PT Time Calculation (min) (ACUTE ONLY): 21 min   Charges:   PT Evaluation $PT Eval Moderate Complexity: 1 Mod PT Treatments $Therapeutic Activity: 8-22 mins      Olga Coaster PT, DPT 3:16 PM,04/07/21

## 2021-04-07 NOTE — Progress Notes (Incomplete)
PROGRESS NOTE    Heather [name]  HWE:[id number] DOB: [birth year] DOA: 04/06/2021 PCP: [facility]  120A/120A-AA   Assessment & Plan:   Principal Problem:   Acute metabolic encephalopathy Active Problems:   COPD (chronic obstructive pulmonary disease) (HCC)   Hypertension   Acute lower UTI    Heather [name] is a 80 y.o. female with medical history significant for COPD with 2.5-3.0 L home oxygen, HTN, cognitive impairment secondary to prior subarachnoid hemorrhage due to head trauma, macular degeneration, depression, and chronic back pain who presented with altered mental status to the ED. Patient was unable to provide history; this was obtained from EMS and later over the phone with her son. Per son, she had worsening lethargy and decreased verbal interaction, but he also reported that she was moved from one nursing facility to another less than a month ago and suspects that she was unhappy with the change. She has also been observed speaking with deceased family members. The patient presented as afebrile and almost completely unresponsive with hypersomnolence. Her O2 saturation on presentation was 96% on room air. On admission, she was able to follow simple commands, and when asked if she was in pain, replied, "No." Physical exam was limited; however, her oral mucosa was dry, she was bradycardic with pulse of 51, lungs were clear to auscultation with normal respiratory effort, and her left wrist was in contracture. Urinalysis positive for nitrites, leukocytes, and bacteria. White count and creatinine normal. CXR showed left lower lobe patchy consolidation that could be atelectasis or airspace disease. CT Head revealed no acute intracranial pathology with prior advanced small vessel white matter disease and encephalomalacia of R>L frontal poles.   #1 Encephalopathy  --Hypersomnolence, no acute changes on CT Head, hx subarachnoid hemorrhage due to head trauma; could be due to gradual  decline and change in living environment --IV ceftriaxone and azithromycin started for possible UTI --Hold home sedating medications of melatonin, oxycodone, Lyrica, Seroquel  PLAN:  --Continue ceftriaxone for suspected UTI.  --Discuss with son whether he wishes to begin palliative care  #2 Suspected UTI --Urinalysis positive for nitrites, leukocytes, bacteria; unable to elicit symptoms from patient due to AMS --Urine culture pending PLAN: Continue ceftriaxone until culture results  #3 LLL lung consolidation noted on CXR --Started on IV ceftriaxone and azithromycin, then azithromycin discontinued --Afebrile, lungs CTA with no respiratory effort, WBC normal, lactic acid negative; therefore, pneumonia not likely --Atelectasis more likely PLAN: --Continue ceftriaxone --Incentive spirometry when patient is responsive  #4 COPD --On 2.5-3.0 L oxygen at home, but SPO2 96% on room air --Put on nasal cannula 3.0 L early on 4/15 but back on room air with 100% O2 saturation  --On admit, O2 and albuterol available as needed  Sinus bradycardia--heart rate in the 50s and low 60s --Metoprolol held PLAN: Continue to monitor  Hypertension --Normotensive on presentation, metoprolol held --Systolic readings 130-160 bear monitoring PLAN: Continue to hold metoprolol given bradycardia and monitor. If readings continue to rise, consider short-acting anti-hypertensive or one that does not affect heart rate.   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 --Happened in Nov 2018 --Delirium and fall precautions placed PLAN: Continue precautions  Chronic back pain and anxiety --Continue hold on oxycodone and Zoloft   DVT prophylaxis: Lovenox SQ Code Status: DNR  Family Communication: Discussed with patient's son Heather [name] Level of care: Med-Surg Dispo:   The [place] [facility] Anticipated d/c is to: Same facility vs SNF Anticipated d/c date  is: 04/10/2021 Patient currently is not medically ready to d/c due to: Altered mental status   Subjective and Interval History:  Patient was sleeping at time of visit. She opened her eyes for a moment but did not respond verbally. Dr. Fran [name] spoke with her son on the phone and discussed changes that can be seen in behavior with dementia patients who more from one facility to another. She also explained the nonspecificity of UA for UTI in elderly females who have limited mobility. They talked about whether or not the patient was beginning to decline and it if was time to get palliative care involved.   Objective: Vitals:   04/07/21 0525 04/07/21 0543 04/07/21 0739 04/07/21 1249  BP: (!) 132/53 (!) 134/53 (!) 157/69 (!) 152/62  Pulse: 67 (!) 50 62 62  Resp: 14 16 14 16   Temp: 97.9 F (36.6 C)  97.6 F (36.4 C) 98.9 F (37.2 C)  TempSrc: Axillary     SpO2: 96% 100% 100% 100%  Weight:        Intake/Output Summary (Last 24 hours) at 04/07/2021 1436 Last data filed at 04/07/2021 0500 Gross per 24 hour  Intake 823.83 ml  Output 300 ml  Net 523.83 ml   Filed Weights   04/06/21 2217  Weight: 69 kg    Examination: ***  Constitutional: NAD, AAOx3 HEENT: conjunctivae and lids normal, EOMI CV: No cyanosis.   RESP: normal respiratory effort, on RA Extremities: No effusions, edema in BLE SKIN: warm, dry Neuro: II - XII grossly intact.   Psych: Normal mood and affect.  Appropriate judgement and reason   Data Reviewed: I have personally reviewed following labs and imaging studies  CBC: Recent Labs  Lab 04/06/21 1910 04/07/21 0703  WBC 5.9 6.2  NEUTROABS 3.1  --   HGB 10.6* 11.6*  HCT 34.6* 37.7  MCV 89.9 92.0  PLT 179 170   Basic Metabolic Panel: Recent Labs  Lab 04/06/21 1910 04/07/21 0523  NA 144 145  K 3.4* 3.2*  CL 117* 105  CO2 24 28  GLUCOSE 83 95  BUN 19 16  CREATININE 0.54 0.60  CALCIUM 7.2* 8.1*  MG  --  2.0  PHOS  --  2.0*   GFR: Estimated Creatinine  Clearance: 46.6 mL/min (by C-G formula based on SCr of 0.6 mg/dL). Liver Function Tests: Recent Labs  Lab 04/06/21 1910  AST 12*  ALT 11  ALKPHOS 52  BILITOT 0.5  PROT 4.7*  ALBUMIN 2.7*   No results for input(s): LIPASE, AMYLASE in the last 168 hours. Recent Labs  Lab 04/06/21 1910  AMMONIA <9*   Coagulation Profile: No results for input(s): INR, PROTIME in the last 168 hours. Cardiac Enzymes: No results for input(s): CKTOTAL, CKMB, CKMBINDEX, TROPONINI in the last 168 hours. BNP (last 3 results) No results for input(s): PROBNP in the last 8760 hours. HbA1C: No results for input(s): HGBA1C in the last 72 hours. CBG: No results for input(s): GLUCAP in the last 168 hours. Lipid Profile: No results for input(s): CHOL, HDL, LDLCALC, TRIG, CHOLHDL, LDLDIRECT in the last 72 hours. Thyroid Function Tests: No results for input(s): TSH, T4TOTAL, FREET4, T3FREE, THYROIDAB in the last 72 hours. Anemia Panel: No results for input(s): VITAMINB12, FOLATE, FERRITIN, TIBC, IRON, RETICCTPCT in the last 72 hours. Sepsis Labs: Recent Labs  Lab 04/06/21 1910  LATICACIDVEN 0.9    Recent Results (from the past 240 hour(s))  SARS CORONAVIRUS 2 (TAT 6-24 HRS) Nasopharyngeal Nasopharyngeal Swab  Status: None   Collection Time: 04/06/21  7:18 PM   Specimen: Nasopharyngeal Swab  Result Value Ref Range Status   SARS Coronavirus 2 NEGATIVE NEGATIVE Final    Comment: (NOTE) SARS-CoV-2 target nucleic acids are NOT DETECTED.  The SARS-CoV-2 RNA is generally detectable in upper and lower respiratory specimens during the acute phase of infection. Negative results do not preclude SARS-CoV-2 infection, do not rule out co-infections with other pathogens, and should not be used as the sole basis for treatment or other patient management decisions. Negative results must be combined with clinical observations, patient history, and epidemiological information. The expected result is  Negative.  Fact Sheet for Patients: HairSlick.no  Fact Sheet for Healthcare Providers: quierodirigir.com  This test is not yet approved or cleared by the [place] FDA and  has been authorized for detection and/or diagnosis of SARS-CoV-2 by FDA under an Emergency Use Authorization (EUA). This EUA will remain  in effect (meaning this test can be used) for the duration of the COVID-19 declaration under Se ction 564(b)(1) of the Act, 21 U.S.C. section 360bbb-3(b)(1), unless the authorization is terminated or revoked sooner.  Performed at [facility], [address], [place], [place] [address]   MRSA PCR Screening     Status: None   Collection Time: 04/06/21 11:15 PM   Specimen: Nasopharyngeal  Result Value Ref Range Status   MRSA by PCR NEGATIVE NEGATIVE Final    Comment:        The GeneXpert MRSA Assay (FDA approved for NASAL specimens only), is one component of a comprehensive MRSA colonization surveillance program. It is not intended to diagnose MRSA infection nor to guide or monitor treatment for MRSA infections. Performed at [facility], [address], [place], [place] [address]       Radiology Studies: DG Chest 2 View  Result Date: 04/06/2021 CLINICAL DATA:  Altered level of consciousness, disorientation EXAM: CHEST - 2 VIEW COMPARISON:  10/23/2020 FINDINGS: Frontal and lateral views of the chest are obtained. Cardiac silhouette is enlarged but stable. Dense calcification of the mitral annulus. Moderate hiatal hernia again noted. Lungs are hyperinflated. There is patchy consolidation within the left lower lobe, with blunting of the costophrenic angle. No pneumothorax. IMPRESSION: 1. Left lower lobe consolidation which may reflect airspace disease or atelectasis. 2. Small left pleural effusion. 3. Hiatal hernia. Electronically Signed   By: Sharlet Salina M.D.   On: 04/06/2021 19:09   CT Head Wo  Contrast  Result Date: 04/06/2021 CLINICAL DATA:  Mental status change EXAM: CT HEAD WITHOUT CONTRAST TECHNIQUE: Contiguous axial images were obtained from the base of the skull through the vertex without intravenous contrast. COMPARISON:  02/27/2021 FINDINGS: Brain: No evidence of acute infarction, hemorrhage, hydrocephalus, extra-axial collection or mass lesion/mass effect. Periventricular and deep white matter hypodensity with encephalomalacia of the right greater than left frontal poles, appearance and configuration unchanged. Vascular: No hyperdense vessel or unexpected calcification. Skull: Normal. Negative for fracture or focal lesion. Sinuses/Orbits: No acute finding. Other: None. IMPRESSION: No acute intracranial pathology. 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 Electronically Signed   By: Lauralyn Primes M.D.   On: 04/06/2021 18:54     Scheduled Meds: . enoxaparin (LOVENOX) injection  40 mg Subcutaneous Q24H  . potassium & sodium phosphates  2 packet Oral TID WC & HS  . potassium chloride  40 mEq Oral Q4H   Continuous Infusions:  LOS: 0 days     Andrena Mews, PA-S Preceptor: Darlin Priestly, MD [facility] If 7PM-7AM, please contact night-coverage 04/07/2021, 2:36 PM

## 2021-04-07 NOTE — Plan of Care (Signed)
Palliative: Thank you for this consult. Unfortunately due to high volume of consults there will be a delay in a Palliative Provider seeing this patient. Palliative Medicine will return to service on 04/10/2021 and will see patient at that time.  No charge  Bernd Crom, NP Palliative Medicine Please call Palliative Medicine team phone with any questions [phone]. For individual providers please see AMION. 

## 2021-04-07 NOTE — Plan of Care (Signed)
Shift Summary: Pt admitted from ED overnight. Oriented to self, thought she was at [facility]. VSS, remains on baseline 3L [place], no endorsement/signs of pain. LR/K+ infusing per MAR. Fall/safety precautions in place, rounding performed, needs/concerns addressed during shift.   Problem: Education: Goal: Knowledge of General Education information will improve Description: Including pain rating scale, medication(s)/side effects and non-pharmacologic comfort measures 04/07/2021 0435 by Michail Sermon, RN Outcome: Progressing 04/06/2021 2305 by Michail Sermon, RN Outcome: Progressing   Problem: Health Behavior/Discharge Planning: Goal: Ability to manage health-related needs will improve 04/07/2021 0435 by Michail Sermon, RN Outcome: Progressing 04/06/2021 2305 by Michail Sermon, RN Outcome: Progressing   Problem: Clinical Measurements: Goal: Ability to maintain clinical measurements within normal limits will improve 04/07/2021 0435 by Michail Sermon, RN Outcome: Progressing 04/06/2021 2305 by Michail Sermon, RN Outcome: Progressing Goal: Will remain free from infection 04/07/2021 0435 by Michail Sermon, RN Outcome: Progressing 04/06/2021 2305 by Michail Sermon, RN Outcome: Progressing Goal: Diagnostic test results will improve 04/07/2021 0435 by Michail Sermon, RN Outcome: Progressing 04/06/2021 2305 by Michail Sermon, RN Outcome: Progressing Goal: Respiratory complications will improve 04/07/2021 0435 by Michail Sermon, RN Outcome: Progressing 04/06/2021 2305 by Michail Sermon, RN Outcome: Progressing Goal: Cardiovascular complication will be avoided 04/07/2021 0435 by Michail Sermon, RN Outcome: Progressing 04/06/2021 2305 by Michail Sermon, RN Outcome: Progressing   Problem: Activity: Goal: Risk for activity intolerance will decrease 04/07/2021 0435 by Michail Sermon, RN Outcome: Progressing 04/06/2021 2305 by Michail Sermon, RN Outcome: Progressing   Problem:  Nutrition: Goal: Adequate nutrition will be maintained 04/07/2021 0435 by Michail Sermon, RN Outcome: Progressing 04/06/2021 2305 by Michail Sermon, RN Outcome: Progressing   Problem: Coping: Goal: Level of anxiety will decrease 04/07/2021 0435 by Michail Sermon, RN Outcome: Progressing 04/06/2021 2305 by Michail Sermon, RN Outcome: Progressing   Problem: Elimination: Goal: Will not experience complications related to bowel motility 04/07/2021 0435 by Michail Sermon, RN Outcome: Progressing 04/06/2021 2305 by Michail Sermon, RN Outcome: Progressing Goal: Will not experience complications related to urinary retention 04/07/2021 0435 by Michail Sermon, RN Outcome: Progressing 04/06/2021 2305 by Michail Sermon, RN Outcome: Progressing   Problem: Pain Managment: Goal: General experience of comfort will improve 04/07/2021 0435 by Michail Sermon, RN Outcome: Progressing 04/06/2021 2305 by Michail Sermon, RN Outcome: Progressing   Problem: Safety: Goal: Ability to remain free from injury will improve 04/07/2021 0435 by Michail Sermon, RN Outcome: Progressing 04/06/2021 2305 by Michail Sermon, RN Outcome: Progressing   Problem: Skin Integrity: Goal: Risk for impaired skin integrity will decrease 04/07/2021 0435 by Michail Sermon, RN Outcome: Progressing 04/06/2021 2305 by Michail Sermon, RN Outcome: Progressing

## 2021-04-07 NOTE — Care Management Obs Status (Signed)
MEDICARE OBSERVATION STATUS NOTIFICATION   Patient Details  Name: Heather [name] MRN: [id number] Date of Birth: [birth year]   Medicare Observation Status Notification Given:  Yes    Allayne Butcher, RN 04/07/2021, 2:05 PM

## 2021-04-07 NOTE — TOC Initial Note (Signed)
Transition of Care [facility]) - Initial/Assessment Note    Patient Details  Name: Heather [name] MRN: [id number] Date of Birth: March 07, 1941  Transition of Care [facility]) CM/SW Contact:    Allayne Butcher, RN Phone Number: 04/07/2021, 2:12 PM  Clinical Narrative:                 Patient placed under observation for altered mental status to rule out UTI, dehydration or medication cause.  RNCM was able to speak with patient's son, Vinetta Bergamo via phone.  Vinetta Bergamo reports that the patient lives at [facility], before that she was at [facility].  Patient is mostly wheelchair bound but does occasionally use a walker.  Patient is incontinent.  Patient also wears chronic O2 at 2-3L.  Vinetta Bergamo would like for patient to return to [facility] but is not against SNF if recommended, she has been to skilled in the past.  PT and OT evals have been ordered.  TOC will cont to follow.   Expected Discharge Plan: Assisted Living Barriers to Discharge: Continued Medical Work up   Patient Goals and CMS Choice Patient states their goals for this hospitalization and ongoing recovery are:: Son would like for patient to return to [facility] but is open to SNF if recommended. CMS Medicare.gov Compare Post Acute Care list provided to:: Patient Represenative (must comment) Choice offered to / list presented to : Adult Children  Expected Discharge Plan and Services Expected Discharge Plan: Assisted Living   Discharge Planning Services: CM Consult   Living arrangements for the past 2 months: Assisted Living Facility                 DME Arranged: N/A DME Agency: NA       HH Arranged: NA          Prior Living Arrangements/Services Living arrangements for the past 2 months: Assisted Living Facility Lives with:: Facility Resident Patient language and need for interpreter reviewed:: Yes Do you feel safe going back to the place where you live?: Yes      Need for Family Participation in Patient Care: Yes (Comment) (altered  mental status) Care giver support system in place?: Yes (comment) (son) Current home services: DME (walker and wheelchair) Criminal Activity/Legal Involvement Pertinent to Current Situation/Hospitalization: No - Comment as needed  Activities of Daily Living   ADL Screening (condition at time of admission) Patient's cognitive ability adequate to safely complete daily activities?: No Is the patient deaf or have difficulty hearing?: No Does the patient have difficulty seeing, even when wearing glasses/contacts?: No Does the patient have difficulty concentrating, remembering, or making decisions?: Yes Patient able to express need for assistance with ADLs?: Yes Does the patient have difficulty dressing or bathing?: Yes Independently performs ADLs?: No Communication: Independent Dressing (OT): Needs assistance Grooming: Needs assistance Feeding: Needs assistance Bathing: Needs assistance Toileting: Needs assistance In/Out Bed: Needs assistance Walks in Home: Needs assistance Does the patient have difficulty walking or climbing stairs?: Yes Weakness of Legs: Both Weakness of Arms/Hands: Both  Permission Sought/Granted Permission sought to share information with : Case Manager,Facility Contact Representative,Family Supports Permission granted to share information with : Yes, Verbal Permission Granted  Share Information with NAME: Vinetta Bergamo  Permission granted to share info w AGENCY: [employer] granted to share info w Relationship: son     Emotional Assessment Appearance:: Appears stated age     Orientation: : Oriented to Self Alcohol / Substance Use: Not Applicable Psych Involvement: No (comment)  Admission diagnosis:  Disorientation [R41.0] Urinary tract infection without hematuria, site unspecified [N39.0] Acute metabolic encephalopathy [G93.41] Community acquired pneumonia of left lower lobe of lung [J18.9] Patient Active Problem List   Diagnosis Date Noted  .  Acute metabolic encephalopathy 04/06/2021  . Hypertension   . Acute lower UTI   . COPD (chronic obstructive pulmonary disease) (HCC) 03/21/2018   PCP:  [facility] Pharmacy:  No Pharmacies Listed    Social Determinants of Health (SDOH) Interventions    Readmission Risk Interventions No flowsheet data found.

## 2021-04-07 NOTE — Evaluation (Signed)
Occupational Therapy Evaluation Patient Details Name: Heather [name] MRN: [id number] DOB: [birth year] Today's Date: 04/07/2021    History of Present Illness Pt is a 80 y.o. 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   Clinical Impression   Pt unable to provide PLOF; oriented to self only. OT called ALF, stated at baseline pt needs significant assistance with ADLs, has vision deficits that impair her ability to feed herself so often needs supervision/setup assistance. Reported pt is WC bound with 2 person assist at baseline. Pt demonstrated baseline mobility/ADLs function. Pt required MAX A for bed mobility. MOD A hair brushing seated EOB - assist for L lateral lean. MAX A don B socks seated EOB. No skilled acute OT needs identified, will sign off at this time.      Follow Up Recommendations  No OT follow up    Equipment Recommendations  None recommended by OT    Recommendations for Other Services       Precautions / Restrictions Precautions Precautions: Fall Restrictions Weight Bearing Restrictions: No      Mobility Bed Mobility Overal bed mobility: Needs Assistance Bed Mobility: Supine to Sit;Sit to Supine     Supine to sit: Max assist;HOB elevated Sit to supine: Mod assist           Balance Overall balance assessment: Needs assistance Sitting-balance support: Feet supported;Bilateral upper extremity supported Sitting balance-Leahy Scale: Poor Sitting balance - Comments: MOD A for lateral lean Postural control: Left lateral lean     Standing balance comment: unable                           ADL either performed or assessed with clinical judgement   ADL Overall ADL's : At baseline                                        General ADL Comments: MOD A hair brushing seated EOB - assist for L lateral lean. MAX A don B socks seated EOB.                  Pertinent Vitals/Pain Pain Assessment: Faces Pain Score: 0-No pain Faces Pain Scale: No hurt     Hand Dominance     Extremity/Trunk Assessment Upper Extremity Assessment Upper Extremity Assessment: Difficult to assess due to impaired cognition;Generalized weakness   Lower Extremity Assessment Lower Extremity Assessment: Difficult to assess due to impaired cognition;Generalized weakness   Cervical / Trunk Assessment Cervical / Trunk Assessment: Kyphotic   Communication Communication Communication: No difficulties   Cognition Arousal/Alertness: Awake/alert Behavior During Therapy: WFL for tasks assessed/performed Overall Cognitive Status: History of cognitive impairments - at baseline                                 General Comments: pt oriented to self only   General Comments       Exercises Exercises: Other exercises Other Exercises Other Exercises: Pt reoriented to situation/place/time Other Exercises: LBD, sup<>sit, grooming, sitting balance/tolerance   Shoulder Instructions      Home Living Family/patient expects to be discharged to:: Assisted living  Home Equipment: Wheelchair - manual          Prior Functioning/Environment Level of Independence: Needs assistance  Gait / Transfers Assistance Needed: pt son pt needs varying levels of assistance for mobility, from a little bit to total assist. Per ALF pt is WC with 2 person assist at baseline ADL's / Homemaking Assistance Needed: needs significant assistance due to vision impairments/cognitive impairment, can feed herself but has limitations due to eye sight   Comments: her son stated she has had many incidences prior to switching ALFs of sliding of bed/chairs/wc to the floor        OT Problem List: Decreased  strength         OT Goals(Current goals can be found in the care plan section) Acute Rehab OT Goals Patient Stated Goal: none stated OT Goal Formulation: Patient unable to participate in goal setting Time For Goal Achievement: 04/21/21 Potential to Achieve Goals: Good   AM-PAC OT "6 Clicks" Daily Activity     Outcome Measure Help from another person eating meals?: A Lot Help from another person taking care of personal grooming?: A Lot Help from another person toileting, which includes using toliet, bedpan, or urinal?: A Lot Help from another person bathing (including washing, rinsing, drying)?: A Lot Help from another person to put on and taking off regular upper body clothing?: A Lot Help from another person to put on and taking off regular lower body clothing?: A Lot 6 Click Score: 12   End of Session Nurse Communication: Mobility status  Activity Tolerance: Patient tolerated treatment well Patient left: in bed;with call bell/phone within reach;with bed alarm set  OT Visit Diagnosis: Other abnormalities of gait and mobility (R26.89)                Time: 0175-1025 OT Time Calculation (min): 8 min Charges:  OT General Charges $OT Visit: 1 Visit OT Evaluation $OT Eval Low Complexity: 1 Low  Kathie Dike, M.S. OTR/L  04/07/21, 3:51 PM  ascom [phone]

## 2021-04-08 DIAGNOSIS — G9341 Metabolic encephalopathy: Secondary | ICD-10-CM | POA: Diagnosis not present

## 2021-04-08 LAB — BASIC METABOLIC PANEL
Anion gap: 5 (ref 5–15)
BUN: 13 mg/dL (ref 8–23)
CO2: 31 mmol/L (ref 22–32)
Calcium: 8.9 mg/dL (ref 8.9–10.3)
Chloride: 108 mmol/L (ref 98–111)
Creatinine, Ser: 0.59 mg/dL (ref 0.44–1.00)
GFR, Estimated: >60 mL/min (ref >60)
Glucose, Bld: 86 mg/dL (ref 70–99)
Potassium: 4.3 mmol/L (ref 3.5–5.1)
Sodium: 144 mmol/L (ref 135–145)

## 2021-04-08 LAB — CBC
HCT: 35.4 % — ABNORMAL LOW (ref 36.0–46.0)
Hemoglobin: 11.4 g/dL — ABNORMAL LOW (ref 12.0–15.0)
MCH: 28.6 pg (ref 26.0–34.0)
MCHC: 32.2 g/dL (ref 30.0–36.0)
MCV: 88.9 fL (ref 80.0–100.0)
Platelets: 185 10*3/uL (ref 150–400)
RBC: 3.98 MIL/uL (ref 3.87–5.11)
RDW: 14.8 % (ref 11.5–15.5)
WBC: 5.7 10*3/uL (ref 4.0–10.5)
nRBC: 0 % (ref 0.0–0.2)

## 2021-04-08 LAB — MAGNESIUM: Magnesium: 1.9 mg/dL (ref 1.7–2.4)

## 2021-04-08 LAB — PHOSPHORUS: Phosphorus: 3.8 mg/dL (ref 2.5–4.6)

## 2021-04-08 MED ORDER — ENSURE PO LIQD: 237.0000 mL | Freq: Two times a day (BID) | ORAL | 12 refills | Status: AC

## 2021-04-08 MED ORDER — MELOXICAM 15 MG PO TABS: 15.0000 mg | ORAL_TABLET | Freq: Every day | ORAL | Status: AC | PRN

## 2021-04-08 MED ORDER — LORAZEPAM 0.5 MG PO TABS
0.5000 mg | ORAL_TABLET | Freq: Every day | ORAL | 0 refills | Status: AC | PRN
Start: 2021-04-08 — End: ?

## 2021-04-08 NOTE — TOC Transition Note (Signed)
Transition of Care [facility]) - CM/SW Discharge Note   Patient Details  Name: Heather [name] MRN: [id number] Date of Birth: July 23, 1941  [facility] CM/SW Contact:  Delphia Grates, [place] Phone Number: 04/08/2021, 1:47 PM   Clinical Narrative:     Attending asked CSW to call and see if patient can go back to [facility]. Thelma Barge stated that she is able to come back but will need EMS transportation as they do not transport on the weekends. CSW will fax over discharge summary info to: [phone].  CSW completed necessity form for transportation and sent to nurse.   No other needs!  Final next level of care: Assisted Living Barriers to Discharge: No Barriers Identified   Patient Goals and CMS Choice Patient states their goals for this hospitalization and ongoing recovery are:: Son would like for patient to return to [facility] but is open to SNF if recommended. CMS Medicare.gov Compare Post Acute Care list provided to:: Patient Represenative (must comment) Choice offered to / list presented to : Adult Children  Discharge Placement                Patient to be transferred to facility by: [facility]   Patient and family notified of of transfer: 04/08/21  Discharge Plan and Services   Discharge Planning Services: CM Consult            DME Arranged: N/A DME Agency: NA       HH Arranged: NA          Social Determinants of Health (SDOH) Interventions     Readmission Risk Interventions No flowsheet data found.

## 2021-04-08 NOTE — Plan of Care (Signed)
Shift Summary: Pt oriented to self only, remains on 2-3L [place], no c/o pain, VSS. X1 large BM overnight, adequate UO. Turns self in bed. Fall/safety precautions in place, rounding performed, needs/concerns addressed.  Problem: Education: Goal: Knowledge of General Education information will improve Description: Including pain rating scale, medication(s)/side effects and non-pharmacologic comfort measures Outcome: Progressing   Problem: Health Behavior/Discharge Planning: Goal: Ability to manage health-related needs will improve Outcome: Progressing   Problem: Clinical Measurements: Goal: Ability to maintain clinical measurements within normal limits will improve Outcome: Progressing Goal: Will remain free from infection Outcome: Progressing Goal: Diagnostic test results will improve Outcome: Progressing Goal: Respiratory complications will improve Outcome: Progressing Goal: Cardiovascular complication will be avoided Outcome: Progressing   Problem: Activity: Goal: Risk for activity intolerance will decrease Outcome: Progressing   Problem: Nutrition: Goal: Adequate nutrition will be maintained Outcome: Progressing   Problem: Coping: Goal: Level of anxiety will decrease Outcome: Progressing   Problem: Elimination: Goal: Will not experience complications related to bowel motility Outcome: Progressing Goal: Will not experience complications related to urinary retention Outcome: Progressing   Problem: Pain Managment: Goal: General experience of comfort will improve Outcome: Progressing   Problem: Safety: Goal: Ability to remain free from injury will improve Outcome: Progressing   Problem: Skin Integrity: Goal: Risk for impaired skin integrity will decrease Outcome: Progressing

## 2021-04-08 NOTE — Discharge Summary (Signed)
Physician Discharge Summary   Heather [name]  female DOB: March 18, 1941  XHB:[id number]  PCP: [facility]  Admit date: 04/06/2021 Discharge date: 04/08/2021  Admitted From: ALF Disposition:  ALF CODE STATUS: DNR  Discharge Instructions    Discharge instructions   Complete by: As directed    Pt will need to be woken up for scheduled hydration and nutrition.   [facility] Course:  For full details, please see H&P, progress notes, consult notes and ancillary notes.  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  Her son states that patient recently moved from [facility] nursing facility to [facility] nursing facility. He suspects that she did not like the change in environment. He has noticed over the last few days she has been much less verbally interactive and seems very lethargic. He says she is normally communicative at her baseline but does demonstrate memory issues as indicates that she had recent conversations with deceased family members. She is largely wheelchair-bound at her baseline but does on occasion ambulate with the use of a walker. There seems to be some motivational component to her level of activity. He says that she has had a significant functional decline since her traumatic brain injury several years ago.   Acute metabolic encephalopathy Delirium Baseline dementia  Suspect multifactorial from dehydration, and sedating medication effect. May have delirium due to change in environment.  Also may be progressive decline in the setting of dementia, brain trauma, old age.  --Son noted more somnolence and less oral intake, however, pt did take food, drinks and medications when  offered by nursing staff while inpatient.   --CT head without acute changes. --all sedating medications held during hospitalization.  Remeron, olanzapine, sertraline, trazodone resumed at discharge.  Ativan resumed as PRN.  Recommend gradually tapering down sedating medications to help improve somnolence. --Pt will need to be woken up for scheduled hydration and nutrition.    UTI, ruled out --UA with bacteria which is not unusual in an elderly female, and likely represent colonization.   --No signs of sepsis.  Son reported pt had not complained of urinary symptoms or abdominal pain.  Pt herself reported no dysuria or bladder pain.  Ceftriaxone x1 given on presentation and abx then stopped.  Left lower lobe lung consolidation likely due to atelectasis Seen on CXR. Suspect atelectasis rather than pneumonia as she is afebrile, without leukocytosis, or obvious respiratory symptoms.  --Ceftriaxone and azithromycin x1 given on presentation and abx then stopped.  Sinus bradycardia: Borderline with heart rate maintaining in the 50s on admission. Home metop held during hospitalization.  Lopressor resumed at 25 mg BID at discharge.  COPD  Per son, pt wears 2.5-3 L supplemental O2 via [place] at all times. However, pt was found to saturate well on room air. With COPD, her O2 sat goal can be 90-92%.  Recommend keeping pt off supplemental O2 unless desat <88%.  Hypertension: Home metop held during hospitalization due to low HR.  Lopressor resumed at 25 mg BID at discharge.  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 Per son, patient exhibits signs of cognitive impairment/dementia and occasional agitated behavior.  Remeron, olanzapine, sertraline, trazodone resumed at discharge.  Ativan resumed as PRN.    Chronic back pain Does not appear pt has an active Rx for  Tramadol.  Tylenol and meloxicam PRN for pain.  Cont Lidocaine patch.  Hypokalemia and hypophos Likely due to poor nutrition.  Repleted with oral supplements.    Hypoalbuminemia  Albumin 2.7.  Supplement with Ensure BID.   Discharge Diagnoses:  Principal Problem:   Acute metabolic encephalopathy Active Problems:   COPD (chronic obstructive pulmonary disease) (HCC)   Hypertension   Urinary tract infection without hematuria   Palliative care by specialist     Discharge Instructions:  Allergies as of 04/08/2021   No Known Allergies     Medication List    STOP taking these medications   traMADol 50 MG tablet Commonly known as: ULTRAM     TAKE these medications   acetaminophen 325 MG tablet Commonly known as: TYLENOL Take 650 mg by mouth every 6 (six) hours as needed.   atorvastatin 20 MG tablet Commonly known as: LIPITOR Take 20 mg by mouth daily.   Cranberry 450 MG Tabs Take 450 mg by mouth daily.   Ensure Take 237 mLs by mouth 2 (two) times daily between meals.   ferrous sulfate 325 (65 FE) MG EC tablet Take 325 mg by mouth every morning.   Fluticasone-Salmeterol 250-50 MCG/DOSE Aepb Commonly known as: ADVAIR Inhale 1 puff into the lungs every 12 (twelve) hours.   Lidocaine 4 % Ptch Place 1 patch onto the skin daily. Remove & Discard patch within 12 hours or as directed by MD left hip   loratadine 5 MG chewable tablet Commonly known as: CLARITIN Chew 5 mg by mouth daily.   LORazepam 0.5 MG tablet Commonly known as: ATIVAN Take 1 tablet (0.5 mg total) by mouth daily as needed for anxiety. What changed:   when to take this  reasons to take this   meloxicam 15 MG tablet Commonly known as: MOBIC Take 1 tablet (15 mg total) by mouth daily as needed for pain. What changed:   when to take this  reasons to take this   metoprolol tartrate 25 MG tablet Commonly known as: LOPRESSOR Take 1 tablet (25 mg total) by mouth 2 (two) times daily. What  changed: how much to take   mirtazapine 7.5 MG tablet Commonly known as: REMERON Take 7.5 mg by mouth daily.   multivitamin with minerals Tabs tablet Take 1 tablet by mouth every morning.   OLANZapine 10 MG tablet Commonly known as: ZYPREXA Take 10 mg by mouth daily.   omeprazole 40 MG capsule Commonly known as: PRILOSEC Take 40 mg by mouth every morning.   ondansetron 8 MG tablet Commonly known as: ZOFRAN Take 8 mg by mouth every 8 (eight) hours as needed for nausea or vomiting.   Polyethyl Glycol-Propyl Glycol 0.4-0.3 % Soln Place 1 drop into both eyes daily.   potassium chloride SA 20 MEQ tablet Commonly known as: KLOR-CON Take 20 mEq by mouth daily.   Salonpas Lidocaine Plus 4-10 % Crea Generic drug: Lidocaine HCl-Benzyl Alcohol Apply topically.   sertraline 100 MG tablet Commonly known as: ZOLOFT Take 150 mg by mouth daily.   sucralfate 1 g tablet Commonly known as: CARAFATE Take 1 g by mouth 3 (three) times daily before meals.   traZODone 50 MG tablet Commonly known as: DESYREL Take 50 mg by mouth at bedtime.   vitamin C 500 MG tablet Commonly known as: ASCORBIC ACID Take 500 mg by mouth daily.   ZINC OXIDE (  TOPICAL) 10 % Crea Apply topically. Apply to buttock and sacrum every shift        Follow-up Information    [facility] Schedule an appointment as soon as possible for a visit in 1 week(s).   Specialty: Geriatric Medicine Contact information: [address] [place] [address] [phone]               No Known Allergies   The results of significant diagnostics from this hospitalization (including imaging, microbiology, ancillary and laboratory) are listed below for reference.   Consultations:   Procedures/Studies: DG Chest 2 View  Result Date: 04/06/2021 CLINICAL DATA:  Altered level of consciousness, disorientation EXAM: CHEST - 2 VIEW COMPARISON:  10/23/2020 FINDINGS: Frontal and lateral  views of the chest are obtained. Cardiac silhouette is enlarged but stable. Dense calcification of the mitral annulus. Moderate hiatal hernia again noted. Lungs are hyperinflated. There is patchy consolidation within the left lower lobe, with blunting of the costophrenic angle. No pneumothorax. IMPRESSION: 1. Left lower lobe consolidation which may reflect airspace disease or atelectasis. 2. Small left pleural effusion. 3. Hiatal hernia. Electronically Signed   By: Sharlet Salina M.D.   On: 04/06/2021 19:09   CT Head Wo Contrast  Result Date: 04/06/2021 CLINICAL DATA:  Mental status change EXAM: CT HEAD WITHOUT CONTRAST TECHNIQUE: Contiguous axial images were obtained from the base of the skull through the vertex without intravenous contrast. COMPARISON:  02/27/2021 FINDINGS: Brain: No evidence of acute infarction, hemorrhage, hydrocephalus, extra-axial collection or mass lesion/mass effect. Periventricular and deep white matter hypodensity with encephalomalacia of the right greater than left frontal poles, appearance and configuration unchanged. Vascular: No hyperdense vessel or unexpected calcification. Skull: Normal. Negative for fracture or focal lesion. Sinuses/Orbits: No acute finding. Other: None. IMPRESSION: No acute intracranial pathology. 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 Electronically Signed   By: Lauralyn Primes M.D.   On: 04/06/2021 18:54      Labs: BNP (last 3 results) Recent Labs    10/23/20 0800  BNP 314.1*   Basic Metabolic Panel: Recent Labs  Lab 04/06/21 1910 04/07/21 0523 04/08/21 0411  NA 144 145 144  K 3.4* 3.2* 4.3  CL 117* 105 108  CO2 GLUCOSE 83 95 86  BUN CREATININE 0.54 0.60 0.59  CALCIUM 7.2* 8.1* 8.9  MG  --  2.0 1.9  PHOS  --  2.0* 3.8   Liver Function Tests: Recent Labs  Lab 04/06/21 1910  AST 12*  ALT 11   ALKPHOS 52  BILITOT 0.5  PROT 4.7*  ALBUMIN 2.7*   No results for input(s): LIPASE, AMYLASE in the last 168 hours. Recent Labs  Lab 04/06/21 1910  AMMONIA <9*   CBC: Recent Labs  Lab 04/06/21 1910 04/07/21 0703 04/08/21 0411  WBC 5.9 6.2 5.7  NEUTROABS 3.1  --   --   HGB 10.6* 11.6* 11.4*  HCT 34.6* 37.7 35.4*  MCV 89.9 92.0 88.9  PLT 179 170 185   Cardiac Enzymes: No results for input(s): CKTOTAL, CKMB, CKMBINDEX, TROPONINI in the last 168 hours. BNP: Invalid input(s): POCBNP CBG: No results for input(s): GLUCAP in the last 168 hours. D-Dimer No results for input(s): DDIMER in the last 72 hours. Hgb A1c No results for input(s): HGBA1C in the last 72 hours. Lipid Profile No results for input(s): CHOL, HDL, LDLCALC, TRIG,  CHOLHDL, LDLDIRECT in the last 72 hours. Thyroid function studies No results for input(s): TSH, T4TOTAL, T3FREE, THYROIDAB in the last 72 hours.  Invalid input(s): FREET3 Anemia work up No results for input(s): VITAMINB12, FOLATE, FERRITIN, TIBC, IRON, RETICCTPCT in the last 72 hours. Urinalysis    Component Value Date/Time   COLORURINE YELLOW (A) 04/06/2021 1918   APPEARANCEUR CLOUDY (A) 04/06/2021 1918   LABSPEC 1.019 04/06/2021 1918   PHURINE 5.0 04/06/2021 1918   GLUCOSEU NEGATIVE 04/06/2021 1918   HGBUR NEGATIVE 04/06/2021 1918   BILIRUBINUR NEGATIVE 04/06/2021 1918   KETONESUR NEGATIVE 04/06/2021 1918   PROTEINUR NEGATIVE 04/06/2021 1918   NITRITE POSITIVE (A) 04/06/2021 1918   LEUKOCYTESUR LARGE (A) 04/06/2021 1918   Sepsis Labs Invalid input(s): PROCALCITONIN,  WBC,  LACTICIDVEN Microbiology Recent Results (from the past 240 hour(s))  SARS CORONAVIRUS 2 (TAT 6-24 HRS) Nasopharyngeal Nasopharyngeal Swab     Status: None   Collection Time: 04/06/21  7:18 PM   Specimen: Nasopharyngeal Swab  Result Value Ref Range Status   SARS Coronavirus 2 NEGATIVE NEGATIVE Final    Comment: (NOTE) SARS-CoV-2 target nucleic acids are NOT  DETECTED.  The SARS-CoV-2 RNA is generally detectable in upper and lower respiratory specimens during the acute phase of infection. Negative results do not preclude SARS-CoV-2 infection, do not rule out co-infections with other pathogens, and should not be used as the sole basis for treatment or other patient management decisions. Negative results must be combined with clinical observations, patient history, and epidemiological information. The expected result is Negative.  Fact Sheet for Patients: HairSlick.no  Fact Sheet for Healthcare Providers: quierodirigir.com  This test is not yet approved or cleared by the [place] FDA and  has been authorized for detection and/or diagnosis of SARS-CoV-2 by FDA under an Emergency Use Authorization (EUA). This EUA will remain  in effect (meaning this test can be used) for the duration of the COVID-19 declaration under Se ction 564(b)(1) of the Act, 21 U.S.C. section 360bbb-3(b)(1), unless the authorization is terminated or revoked sooner.  Performed at [facility], [address], [place], [place] [address]   Urine Culture     Status: Abnormal (Preliminary result)   Collection Time: 04/06/21  7:18 PM   Specimen: Urine, Random  Result Value Ref Range Status   Specimen Description   Final    URINE, RANDOM Performed at [facility], [address] [place], [place] [address]    Special Requests   Final    NONE Performed at [facility] [place], [place] [address]    Culture (A)  Final    >=100,000 COLONIES/mL ESCHERICHIA COLI SUSCEPTIBILITIES TO FOLLOW Performed at [facility], [address] [place], [place] [address]    Report Status PENDING  Incomplete  MRSA PCR Screening     Status: None   Collection Time: 04/06/21 11:15 PM   Specimen: Nasopharyngeal  Result Value Ref Range Status   MRSA by PCR NEGATIVE NEGATIVE Final     Comment:        The GeneXpert MRSA Assay (FDA approved for NASAL specimens only), is one component of a comprehensive MRSA colonization surveillance program. It is not intended to diagnose MRSA infection nor to guide or monitor treatment for MRSA infections. Performed at [facility], [address] [place], [place] [address]      Total time spend on discharging this patient, including the last patient exam, discussing the hospital stay, instructions for  ongoing care as it relates to all pertinent caregivers, as well as preparing the medical discharge records, prescriptions, and/or referrals as applicable, is 45 minutes.    Darlin Priestlyina Leather Estis, MD  [facility] 04/08/2021, 2:33 PM

## 2021-04-09 LAB — URINE CULTURE: Culture: >=100000 — AB

## 2021-05-03 ENCOUNTER — Encounter: Payer: Self-pay | Admitting: Adult Health Nurse Practitioner

## 2021-05-03 ENCOUNTER — Other Ambulatory Visit: Payer: Self-pay

## 2021-05-03 ENCOUNTER — Non-Acute Institutional Stay: Payer: Self-pay | Admitting: Adult Health Nurse Practitioner

## 2021-05-03 VITALS — HR 77 | Ht 59.0 in | Wt 152.0 lb

## 2021-05-03 DIAGNOSIS — R5381 Other malaise: Secondary | ICD-10-CM

## 2021-05-03 DIAGNOSIS — J449 Chronic obstructive pulmonary disease, unspecified: Secondary | ICD-10-CM

## 2021-05-03 DIAGNOSIS — Z515 Encounter for palliative care: Secondary | ICD-10-CM

## 2021-05-03 NOTE — Progress Notes (Signed)
Therapist, nutritional Palliative Care Consult Note Telephone: [phone]  Fax: [phone]    Date of encounter: 05/03/21 PATIENT NAME: Heather [name] [address] [place] [address]   [phone] (home)  DOB: 1941-10-15 MRN: [id number] PRIMARY CARE PROVIDER:    [facility],  [address] [place] [address] [phone]  REFERRING PROVIDER:   [facility] [address] [place],  [place] [address] [phone]  RESPONSIBLE PARTY:    Contact Information    Name Relation Home Work [address]   Mikael Spray Son   [phone]   Albertine Grates   [phone]       I met face to face with patient in facility. Palliative Care was asked to follow this patient by consultation request of  [facility] to address advance care planning and complex medical decision making. This is a follow up visit.  Spoke with son via phone to update on today's visit                                   ASSESSMENT AND PLAN / RECOMMENDATIONS:   Advance Care Planning/Goals of Care: Goals include to maximize quality of life and symptom management.  CODE STATUS: DNR  Symptom Management/Plan:  COPD: Patient COPD is stable at this time.  She uses O2 at 3 L and Advair daily.  Continue current plan of care  Debility: Patient is able to assist with transfers.  She is pretty much chair bound.  She does seem to be having a little bit of a rocky start at new facility but does seem to be slowly adjusting to her new environment.  We will continue to monitor for any decline.    Follow up Palliative Care Visit: Palliative care will continue to follow for complex medical decision making, advance care planning, and clarification of goals. Return 8-10 weeks or prn.  Son encouraged to call with any questions or concerns  I spent 40 minutes providing this consultation. More than 50% of the time in this  consultation was spent in counseling and care coordination.  PPS: 40%  HOSPICE ELIGIBILITY/DIAGNOSIS: TBD  Chief Complaint: follow up palliative visit  HISTORY OF PRESENT ILLNESS:  Rishita Petron is a 80 y.o. year old female  with COPD, HTN, macular degeneration, iron deficiency anemia, depression, neuropathy, cognitive impairment related to previous TBI.  Patient has transitioned from [facility] to the [facility] she was evaluated in ER on 02/27/2021 for altered mental status and unwitnessed fall.  UA was negative and blood work at her baseline.  She complained of left hip pain and x-ray showed no acute fracture.  Was found to have diverticulitis of large intestine and was given Cipro and Flagyl.  Staff reports that she has been sleeping a lot and stays in her room and that she is not eating a lot.  Though to this morning at breakfast looks like she ate over 50% of her meal.  Her weight is stable around 145 to 150 pounds.  Son does state that she lives in a time about 50 years ago and sometimes recognizes him as her brother and not her son.  This may be related to underlying dementia or past TBI or combination of both.  Today she does not talk much during exam.  Though she is pleasant and smiling.  HPI/ROS unreliable secondary to cognitive impairment.  History obtained from review of EMR and interview with family, facility staff and Heather [name].  I reviewed available labs, medications, imaging, studies and related documents from the EMR.  Records reviewed and summarized above.    PHYSICAL EXAM:  General: NAD, frail appearing Eyes: Sclera anicteric and noninjected with no discharge noted EN MT: Moist mucous membranes Cardiovascular: regular rate and rhythm Pulmonary:Lung sounds clear; normal respiratory effort Abdomen: soft, nontender, + bowel sounds Extremities: no edema,patient has kyphosis with scoliosis and contractures noted to feet Skin: no rasheson exposed  skin Neurological: Weakness; alert and oriented to person and place  Thank you for the opportunity to participate in the care of Heather [name].  The palliative care team will continue to follow. Please call our office at [phone] if we can be of additional assistance.   Heather [name] Jenetta Downer, NP , DNP  This chart was dictated using voice recognition software. Despite best efforts to proofread, errors can occur which can change the documentation meaning.   COVID-19 PATIENT SCREENING TOOL Asked and negative response unless otherwise noted:   Have you had symptoms of covid, tested positive or been in contact with someone with symptoms/positive test in the past 5-10 days? Negative

## 2021-08-11 ENCOUNTER — Other Ambulatory Visit: Payer: Self-pay

## 2021-08-11 ENCOUNTER — Emergency Department
Admission: EM | Admit: 2021-08-11 | Discharge: 2021-08-12 | Disposition: A | Discharge status: Home / Self Care | Payer: Medicare Other | Attending: Emergency Medicine | Admitting: Emergency Medicine

## 2021-08-11 ENCOUNTER — Emergency Department: Payer: Medicare Other

## 2021-08-11 DIAGNOSIS — J441 Chronic obstructive pulmonary disease with (acute) exacerbation: Secondary | ICD-10-CM

## 2021-08-11 DIAGNOSIS — Z20822 Contact with and (suspected) exposure to covid-19: Secondary | ICD-10-CM | POA: Diagnosis not present

## 2021-08-11 DIAGNOSIS — Z79899 Other long term (current) drug therapy: Secondary | ICD-10-CM | POA: Diagnosis not present

## 2021-08-11 DIAGNOSIS — Z96642 Presence of left artificial hip joint: Secondary | ICD-10-CM | POA: Diagnosis not present

## 2021-08-11 DIAGNOSIS — Z87891 Personal history of nicotine dependence: Secondary | ICD-10-CM | POA: Insufficient documentation

## 2021-08-11 DIAGNOSIS — Z7951 Long term (current) use of inhaled steroids: Secondary | ICD-10-CM | POA: Diagnosis not present

## 2021-08-11 DIAGNOSIS — R0602 Shortness of breath: Secondary | ICD-10-CM | POA: Diagnosis present

## 2021-08-11 DIAGNOSIS — I10 Essential (primary) hypertension: Secondary | ICD-10-CM | POA: Diagnosis not present

## 2021-08-11 LAB — CBC WITH DIFFERENTIAL/PLATELET
Abs Immature Granulocytes: 0.06 10*3/uL (ref 0.00–0.07)
Basophils Absolute: 0 10*3/uL (ref 0.0–0.1)
Basophils Relative: 0 %
Eosinophils Absolute: 0 10*3/uL (ref 0.0–0.5)
Eosinophils Relative: 0 %
HCT: 33.4 % — ABNORMAL LOW (ref 36.0–46.0)
Hemoglobin: 10.2 g/dL — ABNORMAL LOW (ref 12.0–15.0)
Immature Granulocytes: 1 %
Lymphocytes Relative: 7 %
Lymphs Abs: 0.9 10*3/uL (ref 0.7–4.0)
MCH: 28.3 pg (ref 26.0–34.0)
MCHC: 30.5 g/dL (ref 30.0–36.0)
MCV: 92.5 fL (ref 80.0–100.0)
Monocytes Absolute: 0.1 10*3/uL (ref 0.1–1.0)
Monocytes Relative: 1 %
Neutro Abs: 10.7 10*3/uL — ABNORMAL HIGH (ref 1.7–7.7)
Neutrophils Relative %: 91 %
Platelets: 198 10*3/uL (ref 150–400)
RBC: 3.61 MIL/uL — ABNORMAL LOW (ref 3.87–5.11)
RDW: 14.5 % (ref 11.5–15.5)
WBC: 11.8 10*3/uL — ABNORMAL HIGH (ref 4.0–10.5)
nRBC: 0 % (ref 0.0–0.2)

## 2021-08-11 LAB — BASIC METABOLIC PANEL
Anion gap: 5 (ref 5–15)
BUN: 19 mg/dL (ref 8–23)
CO2: 32 mmol/L (ref 22–32)
Calcium: 8.9 mg/dL (ref 8.9–10.3)
Chloride: 103 mmol/L (ref 98–111)
Creatinine, Ser: 0.58 mg/dL (ref 0.44–1.00)
GFR, Estimated: >60 mL/min (ref >60)
Glucose, Bld: 171 mg/dL — ABNORMAL HIGH (ref 70–99)
Potassium: 3.9 mmol/L (ref 3.5–5.1)
Sodium: 140 mmol/L (ref 135–145)

## 2021-08-11 LAB — TROPONIN I (HIGH SENSITIVITY): Troponin I (High Sensitivity): 5 ng/L (ref <18)

## 2021-08-11 MED ORDER — IPRATROPIUM-ALBUTEROL 0.5-2.5 (3) MG/3ML IN SOLN
3.0000 mL | Freq: Once | RESPIRATORY_TRACT | Status: AC
  Administered 2021-08-11: 3 mL via RESPIRATORY_TRACT
  Filled 2021-08-11: qty 3

## 2021-08-11 MED ORDER — PREDNISONE 20 MG PO TABS
40.0000 mg | ORAL_TABLET | Freq: Once | ORAL | Status: AC
  Administered 2021-08-11: 40 mg via ORAL
  Filled 2021-08-11: qty 2

## 2021-08-11 NOTE — ED Notes (Signed)
ED provider at bedside.

## 2021-08-11 NOTE — ED Provider Notes (Signed)
[facility] Emergency Department Provider Note   ____________________________________________   I have reviewed the triage vital signs and the nursing notes.   HISTORY  Chief Complaint Shortness of Breath   History limited by: Not Limited   HPI Heather [name] is a 80 y.o. female who presents to the emergency department today because of concern for shortness of breath. The patient states that it started today. It is somewhat unclear but it does sound like her oxygen might have run out when she went to dinner. Per family she does have a concentrator in her room. At the time of my exam the patient is not complaining of any shortness of breath. Denies any chest pain. Denies any fevers.     Records reviewed. Per medical record review patient has a history of COPD.   Past Medical History:  Diagnosis Date   COPD (chronic obstructive pulmonary disease) (HCC)    Hypertension    Macular degeneration     Patient Active Problem List   Diagnosis Date Noted   Palliative care by specialist    Acute metabolic encephalopathy 04/06/2021   Hypertension    Urinary tract infection without hematuria    COPD (chronic obstructive pulmonary disease) (HCC) 03/21/2018    Past Surgical History:  Procedure Laterality Date   JOINT REPLACEMENT     Lt hip replacement    Prior to Admission medications   Medication Sig Start Date End Date Taking? Authorizing Provider  acetaminophen (TYLENOL) 325 MG tablet Take 650 mg by mouth every 6 (six) hours as needed.     [provider]  atorvastatin (LIPITOR) 20 MG tablet Take 20 mg by mouth daily.    [provider]  Cranberry 450 MG TABS Take 450 mg by mouth daily.    [provider]  Ensure (ENSURE) Take 237 mLs by mouth 2 (two) times daily between meals. 04/08/21   Darlin Priestly, MD  ferrous sulfate 325 (65 FE) MG EC tablet Take 325 mg by mouth every morning.    [provider]  Fluticasone-Salmeterol  (ADVAIR) 250-50 MCG/DOSE AEPB Inhale 1 puff into the lungs every 12 (twelve) hours.    [provider]  Lidocaine 4 % PTCH Place 1 patch onto the skin daily. Remove & Discard patch within 12 hours or as directed by MD left hip    [provider]  Lidocaine HCl-Benzyl Alcohol (SALONPAS LIDOCAINE PLUS) 4-10 % CREA Apply topically.    [provider]  loratadine (CLARITIN) 5 MG chewable tablet Chew 5 mg by mouth daily.    [provider]  LORazepam (ATIVAN) 0.5 MG tablet Take 1 tablet (0.5 mg total) by mouth daily as needed for anxiety. 04/08/21   Darlin Priestly, MD  meloxicam (MOBIC) 15 MG tablet Take 1 tablet (15 mg total) by mouth daily as needed for pain. 04/08/21   Darlin Priestly, MD  metoprolol tartrate (LOPRESSOR) 25 MG tablet Take 1 tablet (25 mg total) by mouth 2 (two) times daily. Patient taking differently: Take 75 mg by mouth 2 (two) times daily. 03/22/18   Salary, Evelena Asa, MD  mirtazapine (REMERON) 7.5 MG tablet Take 7.5 mg by mouth daily.    [provider]  Multiple Vitamin (MULTIVITAMIN WITH MINERALS) TABS tablet Take 1 tablet by mouth every morning.    [provider]  OLANZapine (ZYPREXA) 10 MG tablet Take 10 mg by mouth daily.    [provider]  omeprazole (PRILOSEC) 40 MG capsule Take 40 mg by mouth  every morning.    [provider]  ondansetron (ZOFRAN) 8 MG tablet Take 8 mg by mouth every 8 (eight) hours as needed for nausea or vomiting.    [provider]  Polyethyl Glycol-Propyl Glycol 0.4-0.3 % SOLN Place 1 drop into both eyes daily.    [provider]  potassium chloride SA (KLOR-CON) 20 MEQ tablet Take 20 mEq by mouth daily.    [provider]  sertraline (ZOLOFT) 100 MG tablet Take 150 mg by mouth daily.    [provider]  sucralfate (CARAFATE) 1 g tablet Take 1 g by mouth 3 (three) times daily before meals.    [provider]  traZODone (DESYREL) 50 MG tablet Take 50  mg by mouth at bedtime.    [provider]  vitamin C (ASCORBIC ACID) 500 MG tablet Take 500 mg by mouth daily.    [provider]  ZINC OXIDE, TOPICAL, 10 % CREA Apply topically. Apply to buttock and sacrum every shift    [provider]    Allergies Patient has no known allergies.  Family History  Problem Relation Age of Onset   Diabetes Mother    Hypertension Father     Social History Social History   Tobacco Use   Smoking status: Former   Smokeless tobacco: Never  Building services engineer Use: Never used  Substance Use Topics   Alcohol use: Not Currently   Drug use: Not Currently    Review of Systems Constitutional: No fever/chills Eyes: No visual changes. ENT: No sore throat. Cardiovascular: Denies chest pain. Respiratory: Positive  shortness of breath. Gastrointestinal: No abdominal pain.  No nausea, no vomiting.  No diarrhea.   Genitourinary: Negative for dysuria. Musculoskeletal: Negative for back pain. Skin: Negative for rash. Neurological: Negative for headaches, focal weakness or numbness.  ____________________________________________   PHYSICAL EXAM:  VITAL SIGNS: ED Triage Vitals  Enc Vitals Group     BP 08/11/21 1835 (!) 157/73     Pulse Rate 08/11/21 1835 77     Resp 08/11/21 1835 20     Temp 08/11/21 1835 97.9 F (36.6 C)     Temp Source 08/11/21 1835 Oral     SpO2 08/11/21 1835 98 %     Weight 08/11/21 1836 151 lb 14.4 oz (68.9 kg)     Height 08/11/21 1836 4\' 11"  (1.499 m)     Head Circumference --      Peak Flow --      Pain Score 08/11/21 1836 0   Constitutional: Awake and alert. Not completely oriented.  Eyes: Conjunctivae are normal.  ENT      Head: Normocephalic and atraumatic.      Nose: No congestion/rhinnorhea.      Mouth/Throat: Mucous membranes are moist.      Neck: No stridor. Hematological/Lymphatic/Immunilogical: No cervical lymphadenopathy. Cardiovascular: Normal rate, regular rhythm.  No murmurs,  rubs, or gallops.  Respiratory: Normal respiratory effort without tachypnea nor retractions. Wheezing to lower lungs.  Gastrointestinal: Soft and non tender. No rebound. No guarding.  Genitourinary: Deferred Musculoskeletal: Normal range of motion in all extremities. No lower extremity edema. Neurologic:  Normal speech and language. No gross focal neurologic deficits are appreciated.  Skin:  Skin is warm, dry and intact. No rash noted. Psychiatric: Mood and affect are normal. Speech and behavior are normal. Patient exhibits appropriate insight and judgment.  ____________________________________________    LABS (pertinent positives/negatives)  Trop hs 5 CBC wbc 11.8, hgb 10.2, plt 198  BMP wnl except glu 171  ____________________________________________   EKG  I, Phineas Semen, attending physician, personally viewed and interpreted this EKG  EKG Time: 1843 Rate: 81 Rhythm: normal sinus rhythm Axis: normal Intervals: qtc 436 QRS: narrow ST changes: no st elevation Impression: normal ekg ____________________________________________    RADIOLOGY  CXR No acute abnormality  ____________________________________________   PROCEDURES  Procedures  ____________________________________________   INITIAL IMPRESSION / ASSESSMENT AND PLAN / ED COURSE  Pertinent labs & imaging results that were available during my care of the patient were reviewed by me and considered in my medical decision making (see chart for details).   Patient presented to the emergency department today because of concerns for shortness of breath.  On exam patient did have some mild wheezing to her lower lungs. X-ray without pneumonia. Patient did improve after breathing treatments and steroids. Given that she feels better will plan on discharging home. Discussed concern for COPD exacerbation.    ____________________________________________   FINAL CLINICAL IMPRESSION(S) / ED DIAGNOSES  Final  diagnoses:  COPD exacerbation (HCC)     Note: This dictation was prepared with Dragon dictation. Any transcriptional errors that result from this process are unintentional     Phineas Semen, MD 08/12/21 [phone]

## 2021-08-11 NOTE — ED Triage Notes (Signed)
Pt comes via EMs from the [facility] with c/o SOb. Pt wears 2 L [place] at home. Pt got duoneb, 125 solu medrol and 20 g in lac. Pt still wheezing per EMs. Pt 96%. Pt has hx of COPD. Pt states treatment helped.

## 2021-08-11 NOTE — ED Triage Notes (Addendum)
Refer to First nurse note:   Pt reporting she was short of breath at dinner and [facility] noted her oxygen tank was off. They turned it back on and upon arrival to ED pt reports she is now feeling back to her baseline. Pt denies pain or shortness of breath at this time.   Cough x 1 week but pt reports when she puts her oxygen on she feels fine. PT is wheezing in triage but reports this is normal

## 2021-08-11 NOTE — ED Notes (Signed)
Oxygen tank replaced °

## 2021-08-12 DIAGNOSIS — J441 Chronic obstructive pulmonary disease with (acute) exacerbation: Secondary | ICD-10-CM | POA: Diagnosis not present

## 2021-08-12 LAB — RESP PANEL BY RT-PCR (FLU A&B, COVID) ARPGX2
Influenza A by PCR: NEGATIVE
Influenza B by PCR: NEGATIVE
SARS Coronavirus 2 by RT PCR: NEGATIVE

## 2021-08-12 MED ORDER — AZITHROMYCIN 250 MG PO TABS: ORAL_TABLET | ORAL | 0 refills | Status: AC

## 2021-08-12 MED ORDER — AZITHROMYCIN 250 MG PO TABS: ORAL_TABLET | ORAL | 0 refills | Status: DC

## 2021-08-12 MED ORDER — PREDNISONE 20 MG PO TABS: 20.0000 mg | ORAL_TABLET | Freq: Every day | ORAL | 0 refills | Status: AC

## 2021-08-12 MED ORDER — PREDNISONE 20 MG PO TABS: 20.0000 mg | ORAL_TABLET | Freq: Every day | ORAL | 0 refills | Status: DC

## 2021-08-12 NOTE — ED Notes (Signed)
E signature pad not working. Pt educated on discharge instructions and verbalized understanding.  

## 2021-08-12 NOTE — ED Notes (Signed)
Called [facility] for transport back to the [facility]

## 2021-08-12 NOTE — Discharge Instructions (Addendum)
Please seek medical attention for any high fevers, chest pain, shortness of breath, change in behavior, persistent vomiting, bloody stool or any other new or concerning symptoms.  

## 2021-08-13 ENCOUNTER — Emergency Department
Admission: EM | Admit: 2021-08-13 | Discharge: 2021-08-13 | Disposition: A | Discharge status: Home / Self Care | Payer: Medicare Other | Attending: Emergency Medicine | Admitting: Emergency Medicine

## 2021-08-13 ENCOUNTER — Encounter: Payer: Self-pay | Admitting: Emergency Medicine

## 2021-08-13 ENCOUNTER — Emergency Department: Payer: Medicare Other

## 2021-08-13 ENCOUNTER — Other Ambulatory Visit: Payer: Self-pay

## 2021-08-13 DIAGNOSIS — Z87891 Personal history of nicotine dependence: Secondary | ICD-10-CM | POA: Diagnosis not present

## 2021-08-13 DIAGNOSIS — J441 Chronic obstructive pulmonary disease with (acute) exacerbation: Secondary | ICD-10-CM | POA: Insufficient documentation

## 2021-08-13 DIAGNOSIS — J9611 Chronic respiratory failure with hypoxia: Secondary | ICD-10-CM | POA: Diagnosis not present

## 2021-08-13 DIAGNOSIS — Z7951 Long term (current) use of inhaled steroids: Secondary | ICD-10-CM | POA: Insufficient documentation

## 2021-08-13 DIAGNOSIS — Z96642 Presence of left artificial hip joint: Secondary | ICD-10-CM | POA: Diagnosis not present

## 2021-08-13 DIAGNOSIS — I10 Essential (primary) hypertension: Secondary | ICD-10-CM | POA: Insufficient documentation

## 2021-08-13 DIAGNOSIS — D72829 Elevated white blood cell count, unspecified: Secondary | ICD-10-CM | POA: Diagnosis not present

## 2021-08-13 DIAGNOSIS — Z79899 Other long term (current) drug therapy: Secondary | ICD-10-CM | POA: Insufficient documentation

## 2021-08-13 DIAGNOSIS — R0602 Shortness of breath: Secondary | ICD-10-CM | POA: Diagnosis present

## 2021-08-13 LAB — URINALYSIS, COMPLETE (UACMP) WITH MICROSCOPIC
Bacteria, UA: NONE SEEN
Bilirubin Urine: NEGATIVE
Glucose, UA: NEGATIVE mg/dL
Hgb urine dipstick: NEGATIVE
Ketones, ur: NEGATIVE mg/dL
Nitrite: NEGATIVE
Protein, ur: NEGATIVE mg/dL
Specific Gravity, Urine: 1.015 (ref 1.005–1.030)
pH: 7 (ref 5.0–8.0)

## 2021-08-13 LAB — CBC
HCT: 35 % — ABNORMAL LOW (ref 36.0–46.0)
Hemoglobin: 10.9 g/dL — ABNORMAL LOW (ref 12.0–15.0)
MCH: 29.5 pg (ref 26.0–34.0)
MCHC: 31.1 g/dL (ref 30.0–36.0)
MCV: 94.6 fL (ref 80.0–100.0)
Platelets: 205 10*3/uL (ref 150–400)
RBC: 3.7 MIL/uL — ABNORMAL LOW (ref 3.87–5.11)
RDW: 14.6 % (ref 11.5–15.5)
WBC: 11 10*3/uL — ABNORMAL HIGH (ref 4.0–10.5)
nRBC: 0 % (ref 0.0–0.2)

## 2021-08-13 LAB — BRAIN NATRIURETIC PEPTIDE: B Natriuretic Peptide: 314.3 pg/mL — ABNORMAL HIGH (ref 0.0–100.0)

## 2021-08-13 LAB — BASIC METABOLIC PANEL
Anion gap: 8 (ref 5–15)
BUN: 20 mg/dL (ref 8–23)
CO2: 32 mmol/L (ref 22–32)
Calcium: 9.1 mg/dL (ref 8.9–10.3)
Chloride: 105 mmol/L (ref 98–111)
Creatinine, Ser: 0.59 mg/dL (ref 0.44–1.00)
GFR, Estimated: >60 mL/min (ref >60)
Glucose, Bld: 85 mg/dL (ref 70–99)
Potassium: 3.9 mmol/L (ref 3.5–5.1)
Sodium: 145 mmol/L (ref 135–145)

## 2021-08-13 LAB — TROPONIN I (HIGH SENSITIVITY)
Troponin I (High Sensitivity): 8 ng/L (ref <18)
Troponin I (High Sensitivity): 7 ng/L (ref <18)

## 2021-08-13 LAB — PROCALCITONIN: Procalcitonin: <0.1 ng/mL

## 2021-08-13 MED ORDER — IPRATROPIUM-ALBUTEROL 0.5-2.5 (3) MG/3ML IN SOLN
9.0000 mL | Freq: Once | RESPIRATORY_TRACT | Status: AC
  Administered 2021-08-13: 9 mL via RESPIRATORY_TRACT
  Filled 2021-08-13: qty 9

## 2021-08-13 MED ORDER — COMPRESSOR/NEBULIZER MISC: 1.0000 [IU] | Freq: Once | 0 refills | Status: AC

## 2021-08-13 MED ORDER — ALBUTEROL SULFATE (2.5 MG/3ML) 0.083% IN NEBU: 2.5000 mg | INHALATION_SOLUTION | Freq: Four times a day (QID) | RESPIRATORY_TRACT | 0 refills | Status: AC | PRN

## 2021-08-13 MED ORDER — ALBUTEROL SULFATE HFA 108 (90 BASE) MCG/ACT IN AERS: 2.0000 | INHALATION_SPRAY | Freq: Four times a day (QID) | RESPIRATORY_TRACT | 2 refills | Status: DC | PRN

## 2021-08-13 MED ORDER — PREDNISONE 20 MG PO TABS
40.0000 mg | ORAL_TABLET | Freq: Once | ORAL | Status: AC
  Administered 2021-08-13: 40 mg via ORAL
  Filled 2021-08-13: qty 2

## 2021-08-13 NOTE — ED Triage Notes (Signed)
First RN Note: pt to ED via [facility] from the [place], pt on chronic 2L via [place]. Per EMS pt repeatedly removed her own O2 then complained of SOB. Per EMS en route pt denies SOB while on baseline O2.   70HR 97% 2L  144/113

## 2021-08-13 NOTE — ED Provider Notes (Signed)
[facility] Emergency Department Provider Note  ____________________________________________   Event Date/Time   First MD Initiated Contact with Patient 08/13/21 1350     (approximate)  I have reviewed the triage vital signs and the nursing notes.   HISTORY  Chief Complaint Shortness of Breath   HPI Heather [name] is a 80 y.o. 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  She denies any change in her chronic cough, chest pain, Donnell pain, nausea, vomiting, diarrhea, headache, earache, sore throat or fevers.  She states she has a chronic cough but this does not feel any different than usual today.  She does state that over the last 1 or 2 days she think she has been urinating more frequently although should not have any pain with urination.  No recent falls or injuries or extremity pain.  No other acute concerns at this time.         Past Medical History:  Diagnosis Date   COPD (chronic obstructive pulmonary disease) (HCC)    Hypertension    Macular degeneration     Patient Active Problem List   Diagnosis Date Noted   Palliative care by specialist    Acute metabolic encephalopathy 04/06/2021   Hypertension    Urinary tract infection without hematuria    COPD (chronic obstructive pulmonary disease) (HCC) 03/21/2018    Past Surgical History:  Procedure Laterality Date   JOINT REPLACEMENT     Lt hip replacement    Prior to Admission medications   Medication Sig Start Date End Date Taking? Authorizing Provider  albuterol  (PROVENTIL) (2.5 MG/3ML) 0.083% nebulizer solution Take 3 mLs (2.5 mg total) by nebulization every 6 (six) hours as needed for up to 7 days for wheezing or shortness of breath. 08/13/21 08/20/21 Yes Gilles Chiquito, MD  Nebulizers (COMPRESSOR/NEBULIZER) MISC 1 Units by Does not apply route once for 1 dose. 08/13/21 08/13/21 Yes Gilles Chiquito, MD  acetaminophen (TYLENOL) 325 MG tablet Take 650 mg by mouth every 6 (six) hours as needed.     [provider]  atorvastatin (LIPITOR) 20 MG tablet Take 20 mg by mouth daily.    [provider]  azithromycin (ZITHROMAX Z-PAK) 250 MG tablet Take 2 tablets (500 mg) on  Day 1,  followed by 1 tablet (250 mg) once daily on Days 2 through 5. 08/12/21 08/17/21  Phineas Semen, MD  Cranberry 450 MG TABS Take 450 mg by mouth daily.    [provider]  Ensure (ENSURE) Take 237 mLs by mouth 2 (two) times daily between meals. 04/08/21   Darlin Priestly, MD  ferrous sulfate 325 (65 FE) MG EC tablet Take 325 mg by mouth every morning.    [provider]  Fluticasone-Salmeterol (ADVAIR) 250-50 MCG/DOSE AEPB Inhale 1 puff into the lungs every 12 (twelve) hours.    [provider]  Lidocaine 4 % PTCH Place 1 patch onto the skin daily. Remove & Discard patch within 12 hours or as directed by MD left hip    [provider]  Lidocaine HCl-Benzyl Alcohol (SALONPAS LIDOCAINE PLUS) 4-10 % CREA Apply topically.  [provider]  loratadine (CLARITIN) 5 MG chewable tablet Chew 5 mg by mouth daily.    [provider]  LORazepam (ATIVAN) 0.5 MG tablet Take 1 tablet (0.5 mg total) by mouth daily as needed for anxiety. 04/08/21   Darlin PriestlyLai, Tina, MD  meloxicam (MOBIC) 15 MG tablet Take 1 tablet (15 mg total) by mouth daily as needed for pain. 04/08/21   Darlin PriestlyLai, Tina, MD  metoprolol tartrate (LOPRESSOR) 25 MG tablet Take 1 tablet (25 mg total) by mouth 2 (two) times daily. Patient taking differently: Take 75 mg by mouth 2 (two) times  daily. 03/22/18   Salary, Evelena AsaMontell D, MD  mirtazapine (REMERON) 7.5 MG tablet Take 7.5 mg by mouth daily.    [provider]  Multiple Vitamin (MULTIVITAMIN WITH MINERALS) TABS tablet Take 1 tablet by mouth every morning.    [provider]  OLANZapine (ZYPREXA) 10 MG tablet Take 10 mg by mouth daily.    [provider]  omeprazole (PRILOSEC) 40 MG capsule Take 40 mg by mouth every morning.    [provider]  ondansetron (ZOFRAN) 8 MG tablet Take 8 mg by mouth every 8 (eight) hours as needed for nausea or vomiting.    [provider]  Polyethyl Glycol-Propyl Glycol 0.4-0.3 % SOLN Place 1 drop into both eyes daily.    [provider]  potassium chloride SA (KLOR-CON) 20 MEQ tablet Take 20 mEq by mouth daily.    [provider]  predniSONE (DELTASONE) 20 MG tablet Take 1 tablet (20 mg total) by mouth daily with breakfast for 3 days. 08/12/21 08/15/21  Phineas SemenGoodman, Graydon, MD  sertraline (ZOLOFT) 100 MG tablet Take 150 mg by mouth daily.    [provider]  sucralfate (CARAFATE) 1 g tablet Take 1 g by mouth 3 (three) times daily before meals.    [provider]  traZODone (DESYREL) 50 MG tablet Take 50 mg by mouth at bedtime.    [provider]  vitamin C (ASCORBIC ACID) 500 MG tablet Take 500 mg by mouth daily.    [provider]  ZINC OXIDE, TOPICAL, 10 % CREA Apply topically. Apply to buttock and sacrum every shift    [provider]    Allergies Patient has no known allergies.  Family History  Problem Relation Age of Onset   Diabetes Mother    Hypertension Father     Social History Social History   Tobacco Use   Smoking status: Former   Smokeless tobacco: Never  Building services engineerVaping Use   Vaping Use: Never used  Substance Use Topics   Alcohol use: Not Currently   Drug use: Not Currently    Review of Systems  Review of Systems  Constitutional:  Negative for chills and fever.  HENT:   Negative for sore throat.   Eyes:  Negative for pain.  Respiratory:  Positive for cough (chronic) and shortness of breath. Negative for stridor.   Cardiovascular:  Negative for chest pain.  Gastrointestinal:  Negative for vomiting.  Genitourinary:  Positive for frequency. Negative for dysuria.  Musculoskeletal:  Negative for myalgias.  Skin:  Negative for rash.  Neurological:  Negative for seizures, loss of consciousness and headaches.  Psychiatric/Behavioral:  Negative for suicidal ideas.   All other systems reviewed and are negative.    ____________________________________________   PHYSICAL EXAM:  VITAL SIGNS: ED Triage Vitals  Enc Vitals Group     BP 08/13/21 0655 (!) 168/96     Pulse Rate 08/13/21 0655  69     Resp 08/13/21 0655 (!) 22     Temp 08/13/21 0655 97.9 F (36.6 C)     Temp Source 08/13/21 0655 Oral     SpO2 08/13/21 0655 98 %     Weight 08/13/21 0653 151 lb 14.4 oz (68.9 kg)     Height 08/13/21 0653 4\' 11"  (1.499 m)     Head Circumference --      Peak Flow --      Pain Score 08/13/21 0653 0     Pain Loc --      Pain Edu? --      Excl. in GC? --    Vitals:   08/13/21 1557 08/13/21 1600  BP: (!) 152/73 (!) 160/67  Pulse: 84 85  Resp: 20 18  Temp: 98 F (36.7 C)   SpO2: 94% 95%   Physical Exam Vitals and nursing note reviewed.  Constitutional:      General: She is not in acute distress.    Appearance: She is well-developed.  HENT:     Head: Normocephalic and atraumatic.     Right Ear: External ear normal.     Left Ear: External ear normal.     Nose: Nose normal.     Mouth/Throat:     Mouth: Mucous membranes are moist.  Eyes:     Conjunctiva/sclera: Conjunctivae normal.  Cardiovascular:     Rate and Rhythm: Normal rate and regular rhythm.     Heart sounds: No murmur heard. Pulmonary:     Effort: Pulmonary effort is normal. No respiratory distress.     Breath sounds: Wheezing present.  Abdominal:     Palpations: Abdomen is soft.      Tenderness: There is no abdominal tenderness.  Musculoskeletal:     Cervical back: Neck supple.     Right lower leg: No edema.     Left lower leg: No edema.  Skin:    General: Skin is warm and dry.     Capillary Refill: Capillary refill takes less than 2 seconds.  Neurological:     Mental Status: She is alert and oriented to person, place, and time.  Psychiatric:        Mood and Affect: Mood normal.     ____________________________________________   LABS (all labs ordered are listed, but only abnormal results are displayed)  Labs Reviewed  CBC - Abnormal; Notable for the following components:      Result Value   WBC 11.0 (*)    RBC 3.70 (*)    Hemoglobin 10.9 (*)    HCT 35.0 (*)    All other components within normal limits  BRAIN NATRIURETIC PEPTIDE - Abnormal; Notable for the following components:   B Natriuretic Peptide 314.3 (*)    All other components within normal limits  URINALYSIS, COMPLETE (UACMP) WITH MICROSCOPIC - Abnormal; Notable for the following components:   Leukocytes,Ua SMALL (*)    All other components within normal limits  BASIC METABOLIC PANEL  PROCALCITONIN  TROPONIN I (HIGH SENSITIVITY)  TROPONIN I (HIGH SENSITIVITY)   ____________________________________________  EKG  Junctional rhythm with a rate of 70, otherwise unremarkable intervals, nonspecific ST change in lead III without other appearance of acute ischemia or significant arrhythmia. ____________________________________________  RADIOLOGY  ED MD interpretation: Chest x-ray remarkable for stable cardiomegaly without overt edema, focal consolidation, pneumothorax, effusion or other clear acute process.  There is evidence of hyperinflation.  Official radiology report(s): DG Chest 2 View  Result Date: 08/13/2021 CLINICAL DATA:  Chest pain and shortness of breath EXAM: CHEST - 2 VIEW COMPARISON:  08/11/2021 FINDINGS: Hyperinflation. Patient rotated left. The Chin overlies the left apex.  Midline trachea. Moderate cardiomegaly. Moderate hiatal hernia. No pleural effusion or pneumothorax. No congestive failure. No lobar consolidation. Left base scarring. IMPRESSION: No acute cardiopulmonary disease. Cardiomegaly without congestive failure. Hyperinflation, consistent with COPD. Hiatal hernia. Electronically Signed   By: Jeronimo Greaves M.D.   On: 08/13/2021 07:41    ____________________________________________   PROCEDURES  Procedure(s) performed (including Critical Care):  .1-3 Lead EKG Interpretation  Date/Time: 08/13/2021 5:54 PM Performed by: Gilles Chiquito, MD Authorized by: Gilles Chiquito, MD     Interpretation: non-specific     ECG rate assessment: normal     Rhythm: other rhythm     Ectopy: none     ____________________________________________   INITIAL IMPRESSION / ASSESSMENT AND PLAN / ED COURSE      Patient presents with above-stated history and exam for assessment of 2 seemingly separate complaints.  First she is complaining of some shortness of breath that began yesterday not associated with increasing cough from baseline, chest pain, fevers or other acute thoracic symptoms.  Second she is complaining of some increased urinary frequency although she does note she is chronically incontinent wears a depends and has not [place] sure if this is the case or not.  She denies any other abdominal urinary symptoms.  On arrival patient has some end expiratory wheezes but is not tachypneic or hypoxic and does not appear in significant respiratory distress.  There is no rhonchi rales and she has no significant edema.  Her abdomen is soft nontender throughout and she has no CVA tenderness.  UA is not suggestive of active cystitis at this time.  We will hold off on antibiotics for this.  With regard to her shortness of breath suspect mild COPD exacerbation.  Chest x-ray has no focal consolidation and she has no fevers or increased cough to suggest bacterial pneumonia.   Procalcitonin is undetectable.  There is no evidence on chest x-ray of pneumothorax, effusion or edema to suggest acute heart failure despite BNP being slightly elevated just above 300.  In addition patient does not appear acutely volume overloaded on exam.  I have a low suspicion for PE at this time given patient is not hypoxic, tachypneic, or tachycardic and has wheezing with mild obstructive airway disease exacerbation much more likely.  ECG and nonelevated troponins x2 are not suggestive of ACS or arrhythmia.  CBC shows mild leukocytosis with WBC count of 11 down from 11.82 days ago.  No acute anemia.  It seems patient did receive a dose of steroids when she was last seen in the ED and expect this may be secondary to that.  On reassessment patient is feeling much better and wheezing is improved.  I think she is stable for discharge back to nursing facility with outpatient follow-up.  She states she has inhalers but does not have any nebulizer breathing treatments and both patient and son requesting this as I think it seems to help much more than her inhalers.  Will write Rx for this.  Advised him to follow-up with the PCP.  Discharged stable condition.  Strict return precautions advised and discussed.         ____________________________________________   FINAL CLINICAL IMPRESSION(S) / ED DIAGNOSES  Final diagnoses:  COPD exacerbation (HCC)  Chronic respiratory failure with hypoxia (HCC)    Medications  ipratropium-albuterol (DUONEB) 0.5-2.5 (3) MG/3ML nebulizer  solution 9 mL (9 mLs Nebulization Given 08/13/21 1424)  predniSONE (DELTASONE) tablet 40 mg (40 mg Oral Given 08/13/21 1422)     ED Discharge Orders          Ordered    albuterol (VENTOLIN HFA) 108 (90 Base) MCG/ACT inhaler  Every 6 hours PRN,   Status:  Discontinued        08/13/21 1750    albuterol (PROVENTIL) (2.5 MG/3ML) 0.083% nebulizer solution  Every 6 hours PRN        08/13/21 1753    Nebulizers (COMPRESSOR/NEBULIZER)  MISC   Once        08/13/21 1753             Note:  This document was prepared using Dragon voice recognition software and may include unintentional dictation errors.    Gilles Chiquito, MD 08/13/21 1754

## 2021-08-18 ENCOUNTER — Other Ambulatory Visit: Payer: Self-pay

## 2021-08-18 ENCOUNTER — Non-Acute Institutional Stay: Payer: Self-pay | Admitting: Student

## 2021-08-18 DIAGNOSIS — R52 Pain, unspecified: Secondary | ICD-10-CM

## 2021-08-18 DIAGNOSIS — K5900 Constipation, unspecified: Secondary | ICD-10-CM

## 2021-08-18 DIAGNOSIS — J449 Chronic obstructive pulmonary disease, unspecified: Secondary | ICD-10-CM

## 2021-08-18 DIAGNOSIS — R0602 Shortness of breath: Secondary | ICD-10-CM

## 2021-08-18 DIAGNOSIS — Z515 Encounter for palliative care: Secondary | ICD-10-CM

## 2021-08-18 NOTE — Progress Notes (Signed)
Therapist, nutritional Palliative Care Consult Note Telephone: [phone]  Fax: [phone]    Date of encounter: 08/18/21 11:17 AM PATIENT NAME: Heather [name] [address] [place] [address]   [phone] (home)  DOB: 1941/08/14 MRN: [id number] PRIMARY CARE PROVIDER:    [facility],  [address] [place] [address] [phone]  REFERRING PROVIDER:   [facility] [address] [place],  [place] [address] [phone]  RESPONSIBLE PARTY:    Contact Information     Name Relation Home Work [place] Son   [phone]   Albertine Grates   [phone]        I met face to face with patient in the facility. Palliative Care was asked to follow this patient by consultation request of  [facility] to address advance care planning and complex medical decision making. This is a follow up visit.                                   ASSESSMENT AND PLAN / RECOMMENDATIONS:   Advance Care Planning/Goals of Care: Goals include to maximize quality of life and symptom management. Our advance care planning conversation included a discussion about:    CODE STATUS: DNR  Symptom Management/Plan:  COPD exacerbation-patient has completed prednisone, Zithromax. Continue Duoneb every 6 hours PRN.   Shortness of breath-secondary to COPD, chronic respiratory failure with hypoxia. Continue nebulizer PRN as directed, oxygen PRN at 2 lpm.   Constipation-start Miralax 17gm every other day. Well balanced diet with fiber encouraged.   Pain-back pain and left hip pain. Continue lidocaine 4% patch to left hip, acetaminophen as directed. Recommend tramadol 25 mg BID PRN for moderate pain.   Follow up Palliative Care Visit: Palliative care will continue to follow for complex medical decision making, advance care planning, and clarification of goals. Return in 8 weeks or  prn.  I spent 40 minutes providing this consultation. More than 50% of the time in this consultation was spent in counseling and care coordination.   PPS: 40%  HOSPICE ELIGIBILITY/DIAGNOSIS: TBD  Chief Complaint: Palliative Medicine follow up visit.   HISTORY OF PRESENT ILLNESS:  Heather [name] is a 80 y.o. year old female  with COPD, anxiety, anemia, dementia, GERD, major depressive disorder, chronic pain, hypertension, macular degeneration. ED visits on 8/19 and 08/13/21 due to shortness of breath, COPD exacerbation.  Patient resides at [facility]. Patient reports feeling constipated; having small bowel movements. She denies pain at present, but does report back pain and left hip pain. She states acetaminophen is not always effective. She is out of bed daily to w/c; assist x 1 for transfers. No recent falls. Staff with assist with adl's. She states her shortness of breath has improved. She has had to wear her oxygen more; wears at 2 lpm PRN. Nebulizer has been effective. She endorses a fair appetite. She is on weekly weights. She is sleeping okay at night. A 10-point review of systems is negative, except for the pertinent positives and negatives detailed in the HPI.    History obtained from review of EMR, discussion with primary team, and interview with family, facility staff/caregiver and/or Ms. Ingalsbe.  I reviewed available labs, medications, imaging, studies and related documents from the EMR.  Records reviewed and summarized above.   Physical Exam: Pulse 72, resp 22, b/p 118/62, sats 96%  at 2 lpm Constitutional: NAD General: frail appearing  EYES: anicteric sclera, lids intact, no discharge  ENMT: intact hearing, oral mucous membranes moist CV: S1S2, RRR, no LE edema Pulmonary: bilateral bases diminished, faint wheeze to right lobes, no increased work of breathing, no cough Abdomen: normo-active BS + 4 quadrants, soft and non tender GU: deferred MSK: moves all extremities,  non-ambulatory Skin: warm and dry, no rashes or wounds on visible skin Neuro: generalized weakness, A & O x 3 Psych: non-anxious affect, pleasant Hem/lymph/immuno: no widespread bruising   Thank you for the opportunity to participate in the care of Heather [name].  The palliative care team will continue to follow. Please call our office at [phone] if we can be of additional assistance.   Ezekiel Slocumb, NP   COVID-19 PATIENT SCREENING TOOL Asked and negative response unless otherwise noted:   Have you had symptoms of covid, tested positive or been in contact with someone with symptoms/positive test in the past 5-10 days? No

## 2021-10-24 DIAGNOSIS — J449 Chronic obstructive pulmonary disease, unspecified: Secondary | ICD-10-CM | POA: Diagnosis not present

## 2021-10-24 DIAGNOSIS — Z87891 Personal history of nicotine dependence: Secondary | ICD-10-CM | POA: Diagnosis not present

## 2021-10-24 DIAGNOSIS — R2689 Other abnormalities of gait and mobility: Secondary | ICD-10-CM | POA: Diagnosis not present

## 2021-10-24 DIAGNOSIS — E785 Hyperlipidemia, unspecified: Secondary | ICD-10-CM | POA: Diagnosis not present

## 2021-10-24 DIAGNOSIS — Z9181 History of falling: Secondary | ICD-10-CM | POA: Diagnosis not present

## 2021-10-24 DIAGNOSIS — I10 Essential (primary) hypertension: Secondary | ICD-10-CM | POA: Diagnosis not present

## 2021-10-24 DIAGNOSIS — M6281 Muscle weakness (generalized): Secondary | ICD-10-CM | POA: Diagnosis not present

## 2021-10-24 DIAGNOSIS — Z9981 Dependence on supplemental oxygen: Secondary | ICD-10-CM | POA: Diagnosis not present

## 2021-10-25 DIAGNOSIS — Q2546 Tortuous aortic arch: Secondary | ICD-10-CM | POA: Diagnosis not present

## 2021-10-25 DIAGNOSIS — R0602 Shortness of breath: Secondary | ICD-10-CM | POA: Diagnosis not present

## 2021-10-25 DIAGNOSIS — J9811 Atelectasis: Secondary | ICD-10-CM | POA: Diagnosis not present

## 2021-10-26 DIAGNOSIS — S0990XD Unspecified injury of head, subsequent encounter: Secondary | ICD-10-CM | POA: Diagnosis not present

## 2021-10-26 DIAGNOSIS — S6290XA Unspecified fracture of unspecified wrist and hand, initial encounter for closed fracture: Secondary | ICD-10-CM | POA: Diagnosis not present

## 2021-10-26 DIAGNOSIS — Z5189 Encounter for other specified aftercare: Secondary | ICD-10-CM | POA: Diagnosis not present

## 2021-10-26 DIAGNOSIS — J441 Chronic obstructive pulmonary disease with (acute) exacerbation: Secondary | ICD-10-CM | POA: Diagnosis not present

## 2021-10-30 DIAGNOSIS — E785 Hyperlipidemia, unspecified: Secondary | ICD-10-CM | POA: Diagnosis not present

## 2021-10-30 DIAGNOSIS — M6281 Muscle weakness (generalized): Secondary | ICD-10-CM | POA: Diagnosis not present

## 2021-10-30 DIAGNOSIS — I10 Essential (primary) hypertension: Secondary | ICD-10-CM | POA: Diagnosis not present

## 2021-10-30 DIAGNOSIS — Z87891 Personal history of nicotine dependence: Secondary | ICD-10-CM | POA: Diagnosis not present

## 2021-10-30 DIAGNOSIS — R2689 Other abnormalities of gait and mobility: Secondary | ICD-10-CM | POA: Diagnosis not present

## 2021-10-30 DIAGNOSIS — Z9181 History of falling: Secondary | ICD-10-CM | POA: Diagnosis not present

## 2021-10-30 DIAGNOSIS — Z9981 Dependence on supplemental oxygen: Secondary | ICD-10-CM | POA: Diagnosis not present

## 2021-10-30 DIAGNOSIS — J449 Chronic obstructive pulmonary disease, unspecified: Secondary | ICD-10-CM | POA: Diagnosis not present

## 2021-11-01 DIAGNOSIS — M6281 Muscle weakness (generalized): Secondary | ICD-10-CM | POA: Diagnosis not present

## 2021-11-01 DIAGNOSIS — D509 Iron deficiency anemia, unspecified: Secondary | ICD-10-CM | POA: Diagnosis not present

## 2021-11-01 DIAGNOSIS — N182 Chronic kidney disease, stage 2 (mild): Secondary | ICD-10-CM | POA: Diagnosis not present

## 2021-11-01 DIAGNOSIS — R2681 Unsteadiness on feet: Secondary | ICD-10-CM | POA: Diagnosis not present

## 2021-11-01 DIAGNOSIS — J449 Chronic obstructive pulmonary disease, unspecified: Secondary | ICD-10-CM | POA: Diagnosis not present

## 2021-11-01 DIAGNOSIS — I129 Hypertensive chronic kidney disease with stage 1 through stage 4 chronic kidney disease, or unspecified chronic kidney disease: Secondary | ICD-10-CM | POA: Diagnosis not present

## 2021-11-01 DIAGNOSIS — E7849 Other hyperlipidemia: Secondary | ICD-10-CM | POA: Diagnosis not present

## 2021-11-10 DIAGNOSIS — Z87891 Personal history of nicotine dependence: Secondary | ICD-10-CM | POA: Diagnosis not present

## 2021-11-10 DIAGNOSIS — Z9181 History of falling: Secondary | ICD-10-CM | POA: Diagnosis not present

## 2021-11-10 DIAGNOSIS — I10 Essential (primary) hypertension: Secondary | ICD-10-CM | POA: Diagnosis not present

## 2021-11-10 DIAGNOSIS — E785 Hyperlipidemia, unspecified: Secondary | ICD-10-CM | POA: Diagnosis not present

## 2021-11-10 DIAGNOSIS — R2689 Other abnormalities of gait and mobility: Secondary | ICD-10-CM | POA: Diagnosis not present

## 2021-11-10 DIAGNOSIS — M6281 Muscle weakness (generalized): Secondary | ICD-10-CM | POA: Diagnosis not present

## 2021-11-10 DIAGNOSIS — Z9981 Dependence on supplemental oxygen: Secondary | ICD-10-CM | POA: Diagnosis not present

## 2021-11-10 DIAGNOSIS — J449 Chronic obstructive pulmonary disease, unspecified: Secondary | ICD-10-CM | POA: Diagnosis not present

## 2021-11-14 ENCOUNTER — Encounter: Payer: Self-pay | Admitting: Emergency Medicine

## 2021-11-14 ENCOUNTER — Emergency Department
Admission: EM | Admit: 2021-11-14 | Discharge: 2021-11-14 | Disposition: A | Discharge status: Home / Self Care | Payer: Medicare Other | Attending: Emergency Medicine | Admitting: Emergency Medicine

## 2021-11-14 ENCOUNTER — Emergency Department: Payer: Medicare Other

## 2021-11-14 DIAGNOSIS — W06XXXA Fall from bed, initial encounter: Secondary | ICD-10-CM | POA: Insufficient documentation

## 2021-11-14 DIAGNOSIS — Z7951 Long term (current) use of inhaled steroids: Secondary | ICD-10-CM | POA: Diagnosis not present

## 2021-11-14 DIAGNOSIS — Z87891 Personal history of nicotine dependence: Secondary | ICD-10-CM | POA: Diagnosis not present

## 2021-11-14 DIAGNOSIS — J449 Chronic obstructive pulmonary disease, unspecified: Secondary | ICD-10-CM | POA: Insufficient documentation

## 2021-11-14 DIAGNOSIS — M79604 Pain in right leg: Secondary | ICD-10-CM | POA: Diagnosis not present

## 2021-11-14 DIAGNOSIS — I10 Essential (primary) hypertension: Secondary | ICD-10-CM | POA: Insufficient documentation

## 2021-11-14 DIAGNOSIS — S99921A Unspecified injury of right foot, initial encounter: Secondary | ICD-10-CM | POA: Diagnosis not present

## 2021-11-14 DIAGNOSIS — Z96642 Presence of left artificial hip joint: Secondary | ICD-10-CM | POA: Insufficient documentation

## 2021-11-14 DIAGNOSIS — Z743 Need for continuous supervision: Secondary | ICD-10-CM | POA: Diagnosis not present

## 2021-11-14 DIAGNOSIS — W010XXA Fall on same level from slipping, tripping and stumbling without subsequent striking against object, initial encounter: Secondary | ICD-10-CM | POA: Diagnosis not present

## 2021-11-14 DIAGNOSIS — Y92129 Unspecified place in nursing home as the place of occurrence of the external cause: Secondary | ICD-10-CM | POA: Diagnosis not present

## 2021-11-14 DIAGNOSIS — R269 Unspecified abnormalities of gait and mobility: Secondary | ICD-10-CM | POA: Diagnosis not present

## 2021-11-14 DIAGNOSIS — Z79899 Other long term (current) drug therapy: Secondary | ICD-10-CM | POA: Diagnosis not present

## 2021-11-14 DIAGNOSIS — R6889 Other general symptoms and signs: Secondary | ICD-10-CM | POA: Diagnosis not present

## 2021-11-14 DIAGNOSIS — S93401A Sprain of unspecified ligament of right ankle, initial encounter: Secondary | ICD-10-CM | POA: Diagnosis not present

## 2021-11-14 DIAGNOSIS — M79671 Pain in right foot: Secondary | ICD-10-CM | POA: Insufficient documentation

## 2021-11-14 DIAGNOSIS — R531 Weakness: Secondary | ICD-10-CM | POA: Diagnosis not present

## 2021-11-14 DIAGNOSIS — Z7401 Bed confinement status: Secondary | ICD-10-CM | POA: Diagnosis not present

## 2021-11-14 DIAGNOSIS — R5381 Other malaise: Secondary | ICD-10-CM | POA: Diagnosis not present

## 2021-11-14 DIAGNOSIS — M25571 Pain in right ankle and joints of right foot: Secondary | ICD-10-CM | POA: Insufficient documentation

## 2021-11-14 DIAGNOSIS — W19XXXA Unspecified fall, initial encounter: Secondary | ICD-10-CM

## 2021-11-14 MED ORDER — ACETAMINOPHEN 500 MG PO TABS
1000.0000 mg | ORAL_TABLET | Freq: Once | ORAL | Status: AC
  Administered 2021-11-14: 1000 mg via ORAL
  Filled 2021-11-14: qty 2

## 2021-11-14 NOTE — Discharge Instructions (Addendum)
We obtained x-rays of the right ankle and foot and there is no broken bone. The patient can have Tylenol as needed for pain.

## 2021-11-14 NOTE — ED Provider Notes (Signed)
[facility]  ____________________________________________   Event Date/Time   First MD Initiated Contact with Patient 11/14/21 0259     (approximate)  I have reviewed the triage vital signs and the nursing notes.   HISTORY  Chief Complaint Fall    HPI Heather [name] is a 80 y.o. female past medical history of COPD on 2L  at baseline, hypertension and macular degeneration who presents after a fall.  Patient tells me she was trying to get into the bed when she slid off falling to the ground.  Says that she fell on her knees and twisted her right foot and ankle.  She complains of pain in the right foot and ankle.  She adamantly denies hitting her head and denies neck pain.  She denies chest abdominal or hip pain.  Denies numbness or tingling.         Past Medical History:  Diagnosis Date   COPD (chronic obstructive pulmonary disease) ([place])    Hypertension    Macular degeneration     Patient Active Problem List   Diagnosis Date Noted   Palliative care by specialist    Acute metabolic encephalopathy [id number]   Hypertension    Urinary tract infection without hematuria    COPD (chronic obstructive pulmonary disease) ([place]) 03/21/2018    Past Surgical History:  Procedure Laterality Date   JOINT REPLACEMENT     Lt hip replacement    Prior to Admission medications   Medication Sig Start Date End Date Taking? Authorizing Provider  acetaminophen (TYLENOL) 325 MG tablet Take 650 mg by mouth every 6 (six) hours as needed.     [provider]  albuterol (PROVENTIL) (2.5 MG/3ML) 0.083% nebulizer solution Take 3 mLs (2.5 mg total) by nebulization every 6 (six) hours as needed for up to 7 days for wheezing or shortness of breath. 08/13/21 08/20/21  Lucrezia Starch, MD  atorvastatin (LIPITOR) 20 MG tablet Take 20 mg by mouth daily.    [provider]  Cranberry 450 MG TABS Take 450 mg by mouth daily.    [provider]  Ensure  (ENSURE) Take 237 mLs by mouth 2 (two) times daily between meals. 04/08/21   Enzo Bi, MD  ferrous sulfate 325 (65 FE) MG EC tablet Take 325 mg by mouth every morning.    [provider]  Fluticasone-Salmeterol (ADVAIR) 250-50 MCG/DOSE AEPB Inhale 1 puff into the lungs every 12 (twelve) hours.    [provider]  Lidocaine 4 % PTCH Place 1 patch onto the skin daily. Remove & Discard patch within 12 hours or as directed by MD left hip    [provider]  Lidocaine HCl-Benzyl Alcohol (SALONPAS LIDOCAINE PLUS) 4-10 % CREA Apply topically.    [provider]  loratadine (CLARITIN) 5 MG chewable tablet Chew 5 mg by mouth daily.    [provider]  LORazepam (ATIVAN) 0.5 MG tablet Take 1 tablet (0.5 mg total) by mouth daily as needed for anxiety. 04/08/21   Enzo Bi, MD  meloxicam (MOBIC) 15 MG tablet Take 1 tablet (15 mg total) by mouth daily as needed for pain. 04/08/21   Enzo Bi, MD  metoprolol tartrate (LOPRESSOR) 25 MG tablet Take 1 tablet (25 mg total) by mouth 2 (two) times daily. Patient taking differently: Take 75 mg by mouth 2 (two) times daily. 03/22/18   Salary, Avel Peace, MD  mirtazapine (REMERON) 7.5 MG tablet Take 7.5 mg by mouth daily.    [provider]  Multiple Vitamin (MULTIVITAMIN WITH MINERALS) TABS tablet Take 1 tablet by mouth every morning.    [provider]  OLANZapine (ZYPREXA) 10 MG tablet Take 10 mg by mouth daily.    [provider]  omeprazole (PRILOSEC) 40 MG capsule Take 40 mg by mouth every morning.    [provider]  ondansetron (ZOFRAN) 8 MG tablet Take 8 mg by mouth every 8 (eight) hours as needed for nausea or vomiting.    [provider]  Polyethyl Glycol-Propyl Glycol 0.4-0.3 % SOLN Place 1 drop into both eyes daily.    [provider]  potassium chloride SA (KLOR-CON) 20 MEQ tablet Take 20 mEq by mouth daily.    [provider]  sertraline (ZOLOFT) 100 MG  tablet Take 150 mg by mouth daily.    [provider]  sucralfate (CARAFATE) 1 g tablet Take 1 g by mouth 3 (three) times daily before meals.    [provider]  traZODone (DESYREL) 50 MG tablet Take 50 mg by mouth at bedtime.    [provider]  vitamin C (ASCORBIC ACID) 500 MG tablet Take 500 mg by mouth daily.    [provider]  ZINC OXIDE, TOPICAL, 10 % CREA Apply topically. Apply to buttock and sacrum every shift    [provider]    Allergies Patient has no known allergies.  Family History  Problem Relation Age of Onset   Diabetes Mother    Hypertension Father     Social History Social History   Tobacco Use   Smoking status: Former   Smokeless tobacco: Never  Building services engineer Use: Never used  Substance Use Topics   Alcohol use: Not Currently   Drug use: Not Currently    Review of Systems   Review of Systems  Musculoskeletal:  Positive for arthralgias and myalgias. Negative for neck pain.  Neurological:  Negative for headaches.  All other systems reviewed and are negative.  Physical Exam Updated Vital Signs BP (!) 150/64 (BP Location: Left Arm)   Pulse 64   Temp (!) 97.5 F (36.4 C) (Oral)   Resp 20   SpO2 98%   Physical Exam Vitals and nursing note reviewed.  Constitutional:      General: She is not in acute distress.    Appearance: Normal appearance.  HENT:     Head: Normocephalic and atraumatic.  Eyes:     General: No scleral icterus.    Conjunctiva/sclera: Conjunctivae normal.  Pulmonary:     Effort: Pulmonary effort is normal. No respiratory distress.     Breath sounds: No stridor.  Abdominal:     General: Abdomen is flat. There is no distension.     Palpations: Abdomen is soft.     Tenderness: There is no abdominal tenderness. There is no guarding.  Musculoskeletal:        General: No deformity or signs of injury.     Cervical back: Normal range of motion.     Comments: Right foot with mild  tenderness of the bilateral malleoli and right first metatarsal without obvious swelling or deformity No tenderness of the right calf, knee thigh or hip, she is able to range the knee and hip without pain No left lower extremity tenderness or deformity  No C-spine tenderness Chest wall is nontender no crepitus  Skin:    General: Skin is dry.     Coloration: Skin is not jaundiced or pale.  Neurological:  General: No focal deficit present.     Mental Status: She is alert and oriented to person, place, and time. Mental status is at baseline.  Psychiatric:        Mood and Affect: Mood normal.        Behavior: Behavior normal.     LABS (all labs ordered are listed, but only abnormal results are displayed)  Labs Reviewed - No data to display ____________________________________________  EKG  N/a ____________________________________________  RADIOLOGY Almeta Monas, personally viewed and evaluated these images (plain radiographs) as part of my medical decision making, as well as reviewing the written report by the radiologist.  ED MD interpretation: I reviewed the x-ray of the foot and ankle which did not show any obvious fracture dislocation    ____________________________________________   PROCEDURES  Procedure(s) performed (including Critical Care):  Procedures   ____________________________________________   INITIAL IMPRESSION / ASSESSMENT AND PLAN / ED COURSE     80 year old female presents after a fall while trying to get into bed.  Did not hit her head.  Onto her knees and seemingly twisted her foot/ankle on the right which is the site of her pain today.  There is no obvious deformity or swelling.  She does have tenderness on the bilateral malleoli and around the first toe.  Will obtain x-rays.  She has no other findings of trauma on exam and is nontender in her chest abdomen and the rest of her extremities.  We will treat with Tylenol.  X-rays of the  foot and ankle are negative.  Patient is stable for discharge back to her facility.      ____________________________________________   FINAL CLINICAL IMPRESSION(S) / ED DIAGNOSES  Final diagnoses:  Fall, initial encounter     ED Discharge Orders     None        Note:  This document was prepared using Dragon voice recognition software and may include unintentional dictation errors.    Rada Hay, MD 11/14/21 [phone]

## 2021-11-14 NOTE — ED Triage Notes (Addendum)
Pt arrived via [facility] from [facility] where pt was attempting to transfer by herself from bed to wheelchair and fell onto bilateral knees with legs underneath buttocks. Pt c/o right leg pain. Pt denies LOC. Denies hitting head. Pt on 2L O2 chronically.

## 2021-11-14 NOTE — ED Notes (Signed)
Pt transported at this time back to [facility] [place] [facility] by [employer]. Pts DNR, discharge paperwork as well as facility forms, placed in brown envelope and sent back with pt.

## 2021-11-25 DIAGNOSIS — J441 Chronic obstructive pulmonary disease with (acute) exacerbation: Secondary | ICD-10-CM | POA: Diagnosis not present

## 2021-11-25 DIAGNOSIS — Z5189 Encounter for other specified aftercare: Secondary | ICD-10-CM | POA: Diagnosis not present

## 2021-11-25 DIAGNOSIS — S6290XA Unspecified fracture of unspecified wrist and hand, initial encounter for closed fracture: Secondary | ICD-10-CM | POA: Diagnosis not present

## 2021-11-25 DIAGNOSIS — S0990XD Unspecified injury of head, subsequent encounter: Secondary | ICD-10-CM | POA: Diagnosis not present

## 2021-11-29 ENCOUNTER — Emergency Department
Admission: EM | Admit: 2021-11-29 | Discharge: 2021-11-29 | Disposition: A | Discharge status: Home / Self Care | Payer: Medicare Other | Attending: Emergency Medicine | Admitting: Emergency Medicine

## 2021-11-29 ENCOUNTER — Emergency Department: Payer: Medicare Other

## 2021-11-29 DIAGNOSIS — Z79899 Other long term (current) drug therapy: Secondary | ICD-10-CM | POA: Insufficient documentation

## 2021-11-29 DIAGNOSIS — R0602 Shortness of breath: Secondary | ICD-10-CM | POA: Diagnosis not present

## 2021-11-29 DIAGNOSIS — Z96642 Presence of left artificial hip joint: Secondary | ICD-10-CM | POA: Diagnosis not present

## 2021-11-29 DIAGNOSIS — R0689 Other abnormalities of breathing: Secondary | ICD-10-CM | POA: Diagnosis not present

## 2021-11-29 DIAGNOSIS — I10 Essential (primary) hypertension: Secondary | ICD-10-CM | POA: Insufficient documentation

## 2021-11-29 DIAGNOSIS — R5381 Other malaise: Secondary | ICD-10-CM | POA: Diagnosis not present

## 2021-11-29 DIAGNOSIS — J441 Chronic obstructive pulmonary disease with (acute) exacerbation: Secondary | ICD-10-CM | POA: Diagnosis not present

## 2021-11-29 DIAGNOSIS — Z20822 Contact with and (suspected) exposure to covid-19: Secondary | ICD-10-CM | POA: Diagnosis not present

## 2021-11-29 DIAGNOSIS — R404 Transient alteration of awareness: Secondary | ICD-10-CM | POA: Diagnosis not present

## 2021-11-29 DIAGNOSIS — Z7951 Long term (current) use of inhaled steroids: Secondary | ICD-10-CM | POA: Diagnosis not present

## 2021-11-29 DIAGNOSIS — Z743 Need for continuous supervision: Secondary | ICD-10-CM | POA: Diagnosis not present

## 2021-11-29 DIAGNOSIS — Z87891 Personal history of nicotine dependence: Secondary | ICD-10-CM | POA: Insufficient documentation

## 2021-11-29 DIAGNOSIS — K449 Diaphragmatic hernia without obstruction or gangrene: Secondary | ICD-10-CM | POA: Diagnosis not present

## 2021-11-29 LAB — CBC WITH DIFFERENTIAL/PLATELET
Abs Immature Granulocytes: 0.06 10*3/uL (ref 0.00–0.07)
Basophils Absolute: 0 10*3/uL (ref 0.0–0.1)
Basophils Relative: 0 %
Eosinophils Absolute: 0.2 10*3/uL (ref 0.0–0.5)
Eosinophils Relative: 2 %
HCT: 34.7 % — ABNORMAL LOW (ref 36.0–46.0)
Hemoglobin: 10.3 g/dL — ABNORMAL LOW (ref 12.0–15.0)
Immature Granulocytes: 1 %
Lymphocytes Relative: 18 %
Lymphs Abs: 1.8 10*3/uL (ref 0.7–4.0)
MCH: 27.1 pg (ref 26.0–34.0)
MCHC: 29.7 g/dL — ABNORMAL LOW (ref 30.0–36.0)
MCV: 91.3 fL (ref 80.0–100.0)
Monocytes Absolute: 1.1 10*3/uL — ABNORMAL HIGH (ref 0.1–1.0)
Monocytes Relative: 11 %
Neutro Abs: 6.9 10*3/uL (ref 1.7–7.7)
Neutrophils Relative %: 68 %
Platelets: 238 10*3/uL (ref 150–400)
RBC: 3.8 MIL/uL — ABNORMAL LOW (ref 3.87–5.11)
RDW: 14.4 % (ref 11.5–15.5)
WBC: 10.1 10*3/uL (ref 4.0–10.5)
nRBC: 0 % (ref 0.0–0.2)

## 2021-11-29 LAB — RESP PANEL BY RT-PCR (FLU A&B, COVID) ARPGX2
Influenza A by PCR: NEGATIVE
Influenza B by PCR: NEGATIVE
SARS Coronavirus 2 by RT PCR: NEGATIVE

## 2021-11-29 LAB — BLOOD GAS, VENOUS
Acid-Base Excess: 8 mmol/L — ABNORMAL HIGH (ref 0.0–2.0)
Bicarbonate: 35.3 mmol/L — ABNORMAL HIGH (ref 20.0–28.0)
O2 Saturation: 67.3 %
Patient temperature: 37
pCO2, Ven: 64 mmHg — ABNORMAL HIGH (ref 44.0–60.0)
pH, Ven: 7.35 (ref 7.250–7.430)
pO2, Ven: 37 mmHg (ref 32.0–45.0)

## 2021-11-29 LAB — BASIC METABOLIC PANEL
Anion gap: 5 (ref 5–15)
BUN: 18 mg/dL (ref 8–23)
CO2: 32 mmol/L (ref 22–32)
Calcium: 9 mg/dL (ref 8.9–10.3)
Chloride: 103 mmol/L (ref 98–111)
Creatinine, Ser: 0.58 mg/dL (ref 0.44–1.00)
GFR, Estimated: >60 mL/min (ref >60)
Glucose, Bld: 100 mg/dL — ABNORMAL HIGH (ref 70–99)
Potassium: 3.8 mmol/L (ref 3.5–5.1)
Sodium: 140 mmol/L (ref 135–145)

## 2021-11-29 LAB — TROPONIN I (HIGH SENSITIVITY)
Troponin I (High Sensitivity): 5 ng/L (ref <18)
Troponin I (High Sensitivity): 6 ng/L (ref <18)

## 2021-11-29 LAB — BRAIN NATRIURETIC PEPTIDE: B Natriuretic Peptide: 90.7 pg/mL (ref 0.0–100.0)

## 2021-11-29 MED ORDER — PREDNISONE 20 MG PO TABS: 40.0000 mg | ORAL_TABLET | Freq: Every day | ORAL | 0 refills | Status: AC

## 2021-11-29 MED ORDER — DOXYCYCLINE HYCLATE 100 MG PO TABS
100.0000 mg | ORAL_TABLET | Freq: Once | ORAL | Status: AC
  Administered 2021-11-29: 100 mg via ORAL
  Filled 2021-11-29: qty 1

## 2021-11-29 MED ORDER — PREDNISONE 20 MG PO TABS
40.0000 mg | ORAL_TABLET | Freq: Once | ORAL | Status: AC
  Administered 2021-11-29: 40 mg via ORAL
  Filled 2021-11-29: qty 2

## 2021-11-29 MED ORDER — IPRATROPIUM-ALBUTEROL 0.5-2.5 (3) MG/3ML IN SOLN
3.0000 mL | Freq: Once | RESPIRATORY_TRACT | Status: AC
  Administered 2021-11-29: 3 mL via RESPIRATORY_TRACT
  Filled 2021-11-29: qty 3

## 2021-11-29 MED ORDER — DOXYCYCLINE HYCLATE 100 MG PO TABS: 100.0000 mg | ORAL_TABLET | Freq: Two times a day (BID) | ORAL | 0 refills | Status: AC

## 2021-11-29 NOTE — ED Triage Notes (Signed)
Pt arrived per EMS from [name] [facility] for SOB, Pt reportedly uses O2 at 2lpm/[name]. Initial SPO2 91-92%.  Pt denies CP at this time

## 2021-11-29 NOTE — ED Provider Notes (Signed)
Emergency Medicine Provider Triage Evaluation Note  Heather [name] , a 80 y.o. female  was evaluated in triage.  Pt complains of SOB.  Review of Systems  Positive: SOB Negative: Chest pain  Physical Exam  There were no vitals taken for this visit. Gen:   Awake, unable to answer all questions appropriately Resp:  Slightly tachypneic with mild increased work of breathing.  Diminished aeration at bases bilaterally.  No significant wheezing, rhonchi or rales appreciated. MSK:   Moves extremities without difficulty  Other:  Regular rate and rhythm  Medical Decision Making  Medically screening exam initiated at 6:48 AM.  Appropriate orders placed.  Heather [name] was informed that the remainder of the evaluation will be completed by another provider, this initial triage assessment does not replace that evaluation, and the importance of remaining in the ED until their evaluation is complete.  Patient here from [facility] with complaints of shortness of breath.  Nursing staff told EMS that patient appeared short of breath tonight and felt "warm".  Afebrile with EMS with temperature of 98.7.  She is chronically on 2-1/2 L nasal cannula.  Has slight increased work of breathing here but is not wheezing.  Will obtain labs, EKG, chest x-ray and give 1 DuoNeb.   Heather [name], Layla Maw, DO 11/29/21 [phone]

## 2021-11-29 NOTE — ED Provider Notes (Signed)
[facility] Emergency Department Provider Note    Event Date/Time   First MD Initiated Contact with Patient 11/29/21 0703     (approximate)  I have reviewed the triage vital signs and the nursing notes.   HISTORY  Chief Complaint Shortness of Breath    HPI Heather [name] is a 80 y.o. female with a history of COPD hypertension and chronic urinary incontinence presents to the ER for evaluation of shortness of breath that started this morning.  Denies any fevers or chills.  Does wear home oxygen at 2 L has not had any increase in O2 requirement.  She denies any chest pain or pressure.  She received 1 DuoNeb after arrival to the ER and is feeling improved.  Still having some mild shortness of breath but overall feels much better.  Past Medical History:  Diagnosis Date   COPD (chronic obstructive pulmonary disease) (HCC)    Hypertension    Macular degeneration    Family History  Problem Relation Age of Onset   Diabetes Mother    Hypertension Father    Past Surgical History:  Procedure Laterality Date   JOINT REPLACEMENT     Lt hip replacement   Patient Active Problem List   Diagnosis Date Noted   Palliative care by specialist    Acute metabolic encephalopathy 04/06/2021   Hypertension    Urinary tract infection without hematuria    COPD (chronic obstructive pulmonary disease) (HCC) 03/21/2018      Prior to Admission medications   Medication Sig Start Date End Date Taking? Authorizing Provider  acetaminophen (TYLENOL) 325 MG tablet Take 650 mg by mouth every 6 (six) hours as needed.    Yes [provider]  atorvastatin (LIPITOR) 20 MG tablet Take 20 mg by mouth daily.   Yes [provider]  Cranberry 450 MG TABS Take 450 mg by mouth daily.   Yes [provider]  doxycycline (VIBRA-TABS) 100 MG tablet Take 1 tablet (100 mg total) by mouth 2 (two) times daily for 7 days. 11/29/21 12/06/21 Yes Willy Eddy, MD   ferrous sulfate 325 (65 FE) MG EC tablet Take 325 mg by mouth every morning.   Yes [provider]  Fluticasone-Salmeterol (ADVAIR) 250-50 MCG/DOSE AEPB Inhale 1 puff into the lungs every 12 (twelve) hours.   Yes [provider]  Lidocaine 4 % PTCH Place 1 patch onto the skin daily. Remove & Discard patch within 12 hours or as directed by MD left hip   Yes [provider]  Lidocaine HCl-Benzyl Alcohol (SALONPAS LIDOCAINE PLUS) 4-10 % CREA Apply topically.   Yes [provider]  loratadine (CLARITIN) 5 MG chewable tablet Chew 5 mg by mouth daily.   Yes [provider]  meloxicam (MOBIC) 15 MG tablet Take 1 tablet (15 mg total) by mouth daily as needed for pain. 04/08/21  Yes Darlin Priestly, MD  metoprolol tartrate (LOPRESSOR) 25 MG tablet Take 1 tablet (25 mg total) by mouth 2 (two) times daily. 03/22/18  Yes Salary, Montell D, MD  mirtazapine (REMERON) 7.5 MG tablet Take 7.5 mg by mouth daily.   Yes [provider]  Multiple Vitamin (MULTIVITAMIN WITH MINERALS) TABS tablet Take 1 tablet by mouth every morning.   Yes [provider]  OLANZapine (ZYPREXA) 10 MG tablet Take 10 mg by mouth daily.   Yes [provider]  omeprazole (PRILOSEC) 40 MG capsule Take 40 mg by mouth every morning.   Yes [provider]  ondansetron (ZOFRAN) 8 MG tablet Take 8 mg by mouth every 8 (eight) hours as needed for nausea or vomiting.   Yes [provider]  Polyethyl Glycol-Propyl Glycol 0.4-0.3 % SOLN Place 1 drop into both eyes daily.   Yes [provider]  polyethylene glycol powder (GLYCOLAX/MIRALAX) 17 GM/SCOOP powder Take 17 g by mouth every other day.   Yes [provider]  potassium chloride SA (KLOR-CON) 20 MEQ tablet Take 20 mEq by mouth daily.   Yes [provider]  predniSONE (DELTASONE) 20 MG tablet Take 2 tablets (40 mg total) by mouth daily for 4 days. 11/30/21 12/04/21 Yes Merlyn Lot, MD   sertraline (ZOLOFT) 100 MG tablet Take 150 mg by mouth daily.   Yes [provider]  sucralfate (CARAFATE) 1 g tablet Take 1 g by mouth 3 (three) times daily before meals.   Yes [provider]  traMADol (ULTRAM) 50 MG tablet Take 50 mg by mouth daily as needed. 10/06/21  Yes [provider]  traZODone (DESYREL) 50 MG tablet Take 50 mg by mouth at bedtime.   Yes [provider]  vitamin C (ASCORBIC ACID) 500 MG tablet Take 800 mg by mouth daily.   Yes [provider]  ZINC OXIDE, TOPICAL, 10 % CREA Apply topically. Apply to buttock and sacrum every shift   Yes [provider]  albuterol (PROVENTIL) (2.5 MG/3ML) 0.083% nebulizer solution Take 3 mLs (2.5 mg total) by nebulization every 6 (six) hours as needed for up to 7 days for wheezing or shortness of breath. 08/13/21 08/20/21  Lucrezia Starch, MD  Ensure (ENSURE) Take 237 mLs by mouth 2 (two) times daily between meals. 04/08/21   Enzo Bi, MD  LORazepam (ATIVAN) 0.5 MG tablet Take 1 tablet (0.5 mg total) by mouth daily as needed for anxiety. 04/08/21   Enzo Bi, MD    Allergies Patient has no known allergies.    Social History Social History   Tobacco Use   Smoking status: Former   Smokeless tobacco: Never  Scientific laboratory technician Use: Never used  Substance Use Topics   Alcohol use: Not Currently   Drug use: Not Currently    Review of Systems Patient denies headaches, rhinorrhea, blurry vision, numbness, shortness of breath, chest pain, edema, cough, abdominal pain, nausea, vomiting, diarrhea, dysuria, fevers, rashes or hallucinations unless otherwise stated above in HPI. ____________________________________________   PHYSICAL EXAM:  VITAL SIGNS: Vitals:   11/29/21 0730 11/29/21 1030  BP: (!) 150/76 (!) 151/78  Pulse: 80 86  Resp: (!) 28 (!) 25  Temp:    SpO2: 97% 95%    Constitutional: Alert and oriented.  Eyes: Conjunctivae are normal.  Head: Atraumatic. Nose: No  congestion/rhinnorhea. Mouth/Throat: Mucous membranes are moist.   Neck: No stridor. Painless ROM.  Cardiovascular: Normal rate, regular rhythm. Grossly normal heart sounds.  Good peripheral circulation. Respiratory: mild tachypnea but appears comfortable, speaking in complete sentences..  No retractions. Lungs with end faint expiratory wheeze, no crackles Gastrointestinal: Soft and nontender. No distention. No abdominal bruits. No CVA tenderness. Genitourinary:  Musculoskeletal: No lower extremity tenderness nor edema.  No joint effusions. Neurologic:  Normal speech and language. No gross focal neurologic deficits are appreciated. No facial droop Skin:  Skin is warm, dry and intact. No rash noted. Psychiatric: Mood and affect are normal. Speech and behavior are normal.  ____________________________________________   LABS (all labs ordered are listed, but only abnormal results are displayed)  Results for orders placed  or performed during the hospital encounter of 11/29/21 (from the past 24 hour(s))  CBC with Differential/Platelet     Status: Abnormal   Collection Time: 11/29/21  6:52 AM  Result Value Ref Range   WBC 10.1 4.0 - 10.5 K/uL   RBC 3.80 (L) 3.87 - 5.11 MIL/uL   Hemoglobin 10.3 (L) 12.0 - 15.0 g/dL   HCT 34.7 (L) 36.0 - 46.0 %   MCV 91.3 80.0 - 100.0 fL   MCH 27.1 26.0 - 34.0 pg   MCHC 29.7 (L) 30.0 - 36.0 g/dL   RDW 14.4 11.5 - 15.5 %   Platelets 238 150 - 400 K/uL   nRBC 0.0 0.0 - 0.2 %   Neutrophils Relative % 68 %   Neutro Abs 6.9 1.7 - 7.7 K/uL   Lymphocytes Relative 18 %   Lymphs Abs 1.8 0.7 - 4.0 K/uL   Monocytes Relative 11 %   Monocytes Absolute 1.1 (H) 0.1 - 1.0 K/uL   Eosinophils Relative 2 %   Eosinophils Absolute 0.2 0.0 - 0.5 K/uL   Basophils Relative 0 %   Basophils Absolute 0.0 0.0 - 0.1 K/uL   Immature Granulocytes 1 %   Abs Immature Granulocytes 0.06 0.00 - 0.07 K/uL  Basic metabolic panel     Status: Abnormal   Collection Time: 11/29/21  6:52  AM  Result Value Ref Range   Sodium 140 135 - 145 mmol/L   Potassium 3.8 3.5 - 5.1 mmol/L   Chloride 103 98 - 111 mmol/L   CO2 32 22 - 32 mmol/L   Glucose, Bld 100 (H) 70 - 99 mg/dL   BUN 18 8 - 23 mg/dL   Creatinine, Ser 0.58 0.44 - 1.00 mg/dL   Calcium 9.0 8.9 - 10.3 mg/dL   GFR, Estimated >60 >60 mL/min   Anion gap 5 5 - 15  Troponin I (High Sensitivity)     Status: None   Collection Time: 11/29/21  6:52 AM  Result Value Ref Range   Troponin I (High Sensitivity) 5 <18 ng/L  Brain natriuretic peptide     Status: None   Collection Time: 11/29/21  6:52 AM  Result Value Ref Range   B Natriuretic Peptide 90.7 0.0 - 100.0 pg/mL  Resp Panel by RT-PCR (Flu A&B, Covid) Nasopharyngeal Swab     Status: None   Collection Time: 11/29/21  6:59 AM   Specimen: Nasopharyngeal Swab; Nasopharyngeal(NP) swabs in vial transport medium  Result Value Ref Range   SARS Coronavirus 2 by RT PCR NEGATIVE NEGATIVE   Influenza A by PCR NEGATIVE NEGATIVE   Influenza B by PCR NEGATIVE NEGATIVE  Blood gas, venous     Status: Abnormal   Collection Time: 11/29/21  8:56 AM  Result Value Ref Range   pH, Ven 7.35 7.250 - 7.430   pCO2, Ven 64 (H) 44.0 - 60.0 mmHg   pO2, Ven 37.0 32.0 - 45.0 mmHg   Bicarbonate 35.3 (H) 20.0 - 28.0 mmol/L   Acid-Base Excess 8.0 (H) 0.0 - 2.0 mmol/L   O2 Saturation 67.3 %   Patient temperature 37.0    Collection site VEIN    Sample type VENOUS   Troponin I (High Sensitivity)     Status: None   Collection Time: 11/29/21 10:35 AM  Result Value Ref Range   Troponin I (High Sensitivity) 6 <18 ng/L   ____________________________________________  EKG My review and personal interpretation at Time: 6:50   Indication: sob  Rate: 80  Rhythm: sinus Axis:  normal Other: occasional pvc, no stemi, no depressions ____________________________________________  RADIOLOGY  I personally reviewed all radiographic images ordered to evaluate for the above acute complaints and reviewed  radiology reports and findings.  These findings were personally discussed with the patient.  Please see medical record for radiology report.  ____________________________________________   PROCEDURES  Procedure(s) performed:  Procedures    Critical Care performed: no ____________________________________________   INITIAL IMPRESSION / ASSESSMENT AND PLAN / ED COURSE  Pertinent labs & imaging results that were available during my care of the patient were reviewed by me and considered in my medical decision making (see chart for details).   DDX: Asthma, copd, CHF, pna, ptx, malignancy, Pe, anemia   Heather [name] is a 80 y.o. who presents to the ED with shortness of breath as described above feeling much improved after nebulizer.  She is afebrile she is hemodynamically stable.  Denies any pain or discomfort at this time.  Blood will be sent for the blood differential will order chest x-ray.  EKG is nonischemic.  Will give prednisone as well as additional nebulizer and observe here in the ER.  I have a much lower suspicion for sepsis PE, ACS or CHF.  Clinical Course as of 11/29/21 1122  Wed Nov 29, 2021  0859 Patient reassessed and she is feeling improved.  Appreciating more wheeze now after her nebulizer. [PR]  1058 Patient reassessed and is feeling significantly improved.  She is requesting discharge home.  She is not hypoxic.  Does have some mild tachypnea which she states is her baseline.  No sign of consolidation on chest x-ray.  Have very low suspicion for PE as she is having more wheezing.  Symptoms are consistent with COPD with acute bronchitis.  I do believe she would be appropriate for outpatient management.  We discussed return precautions. [PR]    Clinical Course User Index [PR] Merlyn Lot, MD    The patient was evaluated in Emergency Department today for the symptoms described in the history of present illness. He/she was evaluated in the context of the global  COVID-19 pandemic, which necessitated consideration that the patient might be at risk for infection with the SARS-CoV-2 virus that causes COVID-19. Institutional protocols and algorithms that pertain to the evaluation of patients at risk for COVID-19 are in a state of rapid change based on information released by regulatory bodies including the CDC and federal and state organizations. These policies and algorithms were followed during the patient's care in the ED.  As part of my medical decision making, I reviewed the following data within the [place] notes reviewed and incorporated, Labs reviewed, notes from prior ED visits and [place] Controlled Substance Database   ____________________________________________   FINAL CLINICAL IMPRESSION(S) / ED DIAGNOSES  Final diagnoses:  Shortness of breath  COPD exacerbation (HCC)      NEW MEDICATIONS STARTED DURING THIS VISIT:  New Prescriptions   DOXYCYCLINE (VIBRA-TABS) 100 MG TABLET    Take 1 tablet (100 mg total) by mouth 2 (two) times daily for 7 days.   PREDNISONE (DELTASONE) 20 MG TABLET    Take 2 tablets (40 mg total) by mouth daily for 4 days.     Note:  This document was prepared using Dragon voice recognition software and may include unintentional dictation errors.    Merlyn Lot, MD 11/29/21 1122

## 2021-11-30 ENCOUNTER — Emergency Department
Admission: EM | Admit: 2021-11-30 | Discharge: 2021-11-30 | Disposition: A | Discharge status: Home / Self Care | Payer: Medicare Other | Attending: Emergency Medicine | Admitting: Emergency Medicine

## 2021-11-30 ENCOUNTER — Emergency Department: Payer: Medicare Other

## 2021-11-30 DIAGNOSIS — Z79899 Other long term (current) drug therapy: Secondary | ICD-10-CM | POA: Diagnosis not present

## 2021-11-30 DIAGNOSIS — Z87891 Personal history of nicotine dependence: Secondary | ICD-10-CM | POA: Insufficient documentation

## 2021-11-30 DIAGNOSIS — J441 Chronic obstructive pulmonary disease with (acute) exacerbation: Secondary | ICD-10-CM

## 2021-11-30 DIAGNOSIS — Z20822 Contact with and (suspected) exposure to covid-19: Secondary | ICD-10-CM | POA: Insufficient documentation

## 2021-11-30 DIAGNOSIS — J449 Chronic obstructive pulmonary disease, unspecified: Secondary | ICD-10-CM | POA: Diagnosis not present

## 2021-11-30 DIAGNOSIS — R5381 Other malaise: Secondary | ICD-10-CM | POA: Diagnosis not present

## 2021-11-30 DIAGNOSIS — I10 Essential (primary) hypertension: Secondary | ICD-10-CM | POA: Insufficient documentation

## 2021-11-30 DIAGNOSIS — Z96642 Presence of left artificial hip joint: Secondary | ICD-10-CM | POA: Insufficient documentation

## 2021-11-30 DIAGNOSIS — R6889 Other general symptoms and signs: Secondary | ICD-10-CM | POA: Diagnosis not present

## 2021-11-30 DIAGNOSIS — J9811 Atelectasis: Secondary | ICD-10-CM | POA: Diagnosis not present

## 2021-11-30 DIAGNOSIS — R069 Unspecified abnormalities of breathing: Secondary | ICD-10-CM | POA: Diagnosis not present

## 2021-11-30 DIAGNOSIS — K449 Diaphragmatic hernia without obstruction or gangrene: Secondary | ICD-10-CM | POA: Diagnosis not present

## 2021-11-30 DIAGNOSIS — R0602 Shortness of breath: Secondary | ICD-10-CM | POA: Insufficient documentation

## 2021-11-30 DIAGNOSIS — Z743 Need for continuous supervision: Secondary | ICD-10-CM | POA: Diagnosis not present

## 2021-11-30 LAB — CBC WITH DIFFERENTIAL/PLATELET
Abs Immature Granulocytes: 0.1 10*3/uL — ABNORMAL HIGH (ref 0.00–0.07)
Basophils Absolute: 0 10*3/uL (ref 0.0–0.1)
Basophils Relative: 0 %
Eosinophils Absolute: 0.1 10*3/uL (ref 0.0–0.5)
Eosinophils Relative: 1 %
HCT: 33 % — ABNORMAL LOW (ref 36.0–46.0)
Hemoglobin: 9.8 g/dL — ABNORMAL LOW (ref 12.0–15.0)
Immature Granulocytes: 1 %
Lymphocytes Relative: 20 %
Lymphs Abs: 2 10*3/uL (ref 0.7–4.0)
MCH: 27.5 pg (ref 26.0–34.0)
MCHC: 29.7 g/dL — ABNORMAL LOW (ref 30.0–36.0)
MCV: 92.4 fL (ref 80.0–100.0)
Monocytes Absolute: 1 10*3/uL (ref 0.1–1.0)
Monocytes Relative: 10 %
Neutro Abs: 6.6 10*3/uL (ref 1.7–7.7)
Neutrophils Relative %: 68 %
Platelets: 251 10*3/uL (ref 150–400)
RBC: 3.57 MIL/uL — ABNORMAL LOW (ref 3.87–5.11)
RDW: 14.2 % (ref 11.5–15.5)
WBC: 9.8 10*3/uL (ref 4.0–10.5)
nRBC: 0 % (ref 0.0–0.2)

## 2021-11-30 LAB — RESP PANEL BY RT-PCR (FLU A&B, COVID) ARPGX2
Influenza A by PCR: NEGATIVE
Influenza B by PCR: NEGATIVE
SARS Coronavirus 2 by RT PCR: NEGATIVE

## 2021-11-30 LAB — BASIC METABOLIC PANEL
Anion gap: 9 (ref 5–15)
BUN: 20 mg/dL (ref 8–23)
CO2: 30 mmol/L (ref 22–32)
Calcium: 8.9 mg/dL (ref 8.9–10.3)
Chloride: 103 mmol/L (ref 98–111)
Creatinine, Ser: 0.5 mg/dL (ref 0.44–1.00)
GFR, Estimated: >60 mL/min (ref >60)
Glucose, Bld: 104 mg/dL — ABNORMAL HIGH (ref 70–99)
Potassium: 3.4 mmol/L — ABNORMAL LOW (ref 3.5–5.1)
Sodium: 142 mmol/L (ref 135–145)

## 2021-11-30 LAB — D-DIMER, QUANTITATIVE: D-Dimer, Quant: 1.73 ug/mL-FEU — ABNORMAL HIGH (ref 0.00–0.50)

## 2021-11-30 LAB — TROPONIN I (HIGH SENSITIVITY): Troponin I (High Sensitivity): 5 ng/L (ref <18)

## 2021-11-30 MED ORDER — IPRATROPIUM-ALBUTEROL 0.5-2.5 (3) MG/3ML IN SOLN
3.0000 mL | Freq: Once | RESPIRATORY_TRACT | Status: AC
  Administered 2021-11-30: 3 mL via RESPIRATORY_TRACT
  Filled 2021-11-30: qty 3

## 2021-11-30 MED ORDER — METHYLPREDNISOLONE SODIUM SUCC 125 MG IJ SOLR
125.0000 mg | Freq: Once | INTRAMUSCULAR | Status: AC
  Administered 2021-11-30: 125 mg via INTRAVENOUS
  Filled 2021-11-30: qty 2

## 2021-11-30 MED ORDER — IOHEXOL 350 MG/ML SOLN
80.0000 mL | Freq: Once | INTRAVENOUS | Status: AC | PRN
  Administered 2021-11-30: 80 mL via INTRAVENOUS

## 2021-11-30 NOTE — ED Notes (Signed)
[facility]  CALLED  FOR T RANSPORT  TO  THE  [facility] 

## 2021-11-30 NOTE — ED Provider Notes (Addendum)
Emergency department handoff note  Care of this patient was signed out to me at the end of the previous provider shift.  All pertinent patient information was conveyed and all questions were answered.  Patient pending CT angiography of the chest showing no evidence of pulmonary embolism.  It does however show evidence of edema with bronchial thickening concerning for bronchitis and consistent with COPD exacerbation.  Patient is resting comfortably upon my reassessment and satting well on her normal 2 L of oxygen by nasal cannula.  The patient has been reexamined and is ready to be discharged.  All diagnostic results have been reviewed and discussed with the patient/family.  Care plan has been outlined and the patient/family understands all current diagnoses, results, and treatment plans.  There are no new complaints, changes, or physical findings at this time.  All questions have been addressed and answered.  All medications, if any, that were given while in the emergency department or any that are being prescribed have been reviewed with the patient/family.  All side effects and adverse reactions have been explained.  Patient was instructed to, and agrees to follow-up with their primary care physician as well as return to the emergency department if any new or worsening symptoms develop.   Merwyn Katos, MD 11/30/21 2010    Merwyn Katos, MD 11/30/21 [phone]

## 2021-11-30 NOTE — ED Provider Notes (Signed)
[facility] Emergency Department Provider Note  ____________________________________________   Event Date/Time   First MD Initiated Contact with Patient 11/30/21 [phone]     (approximate)  I have reviewed the triage vital signs and the nursing notes.   HISTORY  Chief Complaint Shortness of Breath    HPI Heather [name] is a 80 y.o. 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  Patient is a very poor historian.  She was seen in the emergency department yesterday for the same and symptoms improved after DuoNeb and steroids.  She was diagnosed with acute bronchitis and discharged on 40 mg of prednisone and doxycycline which does not appear to have been started by her nursing facility.  She is tachypneic here.  No increase in her oxygen requirement.  She denies any known fevers or cough.  Denies any chest pain.  Denies history of CHF, PE or DVT.        Past Medical History:  Diagnosis Date   COPD (chronic obstructive pulmonary disease) ([place])    Hypertension    Macular degeneration     Patient Active Problem List   Diagnosis Date Noted   Palliative care by specialist    Acute metabolic encephalopathy [id number]   Hypertension    Urinary tract infection without hematuria    COPD (chronic obstructive pulmonary disease) ([place]) 03/21/2018    Past Surgical History:  Procedure Laterality Date   JOINT REPLACEMENT     Lt hip replacement    Prior to Admission medications   Medication Sig Start Date End Date Taking? Authorizing Provider  acetaminophen (TYLENOL) 325 MG tablet Take 650 mg by mouth every 6 (six) hours as needed.     [provider]  albuterol (PROVENTIL) (2.5 MG/3ML) 0.083% nebulizer solution Take 3 mLs (2.5 mg total) by nebulization every 6 (six) hours as needed for up to 7 days for wheezing or shortness of breath. 08/13/21 08/20/21  Lucrezia Starch, MD  atorvastatin (LIPITOR) 20 MG tablet Take 20 mg by mouth daily.    [provider]  Cranberry 450 MG TABS Take 450 mg by mouth daily.    [provider]  doxycycline (VIBRA-TABS) 100 MG tablet Take 1 tablet (100 mg total) by mouth 2 (two) times daily for 7 days. 11/29/21 12/06/21  Merlyn Lot, MD  Ensure (ENSURE) Take 237 mLs by mouth 2 (two) times daily between meals. 04/08/21   Enzo Bi, MD  ferrous sulfate 325 (65 FE) MG EC tablet Take 325 mg by mouth every morning.    [provider]  Fluticasone-Salmeterol (ADVAIR) 250-50 MCG/DOSE AEPB Inhale 1 puff into the lungs every 12 (twelve) hours.    [provider]  Lidocaine 4 % PTCH Place 1 patch onto the skin daily. Remove & Discard patch within 12 hours or as directed by MD left hip    [provider]  Lidocaine HCl-Benzyl Alcohol (SALONPAS LIDOCAINE PLUS) 4-10 % CREA Apply topically.    [provider]  loratadine (CLARITIN) 5 MG chewable tablet Chew 5 mg by mouth daily.    [provider]  LORazepam (ATIVAN) 0.5 MG tablet Take 1 tablet (0.5 mg total) by mouth daily as needed for anxiety. 04/08/21   Enzo Bi, MD  meloxicam (MOBIC) 15 MG tablet Take 1 tablet (15 mg total) by mouth daily as needed for pain. 04/08/21   Enzo Bi, MD  metoprolol tartrate (LOPRESSOR)  25 MG tablet Take 1 tablet (25 mg total) by mouth 2 (two) times daily. 03/22/18   Salary, Avel Peace, MD  mirtazapine (REMERON) 7.5 MG tablet Take 7.5 mg by mouth daily.    [provider]  Multiple Vitamin (MULTIVITAMIN WITH MINERALS) TABS tablet Take 1 tablet by mouth every morning.    [provider]  OLANZapine (ZYPREXA) 10 MG tablet Take 10 mg by mouth daily.    [provider]  omeprazole (PRILOSEC) 40 MG capsule Take 40 mg by mouth every morning.    [provider]  ondansetron (ZOFRAN) 8 MG tablet Take 8 mg by mouth every 8 (eight) hours as needed for nausea or  vomiting.    [provider]  Polyethyl Glycol-Propyl Glycol 0.4-0.3 % SOLN Place 1 drop into both eyes daily.    [provider]  polyethylene glycol powder (GLYCOLAX/MIRALAX) 17 GM/SCOOP powder Take 17 g by mouth every other day.    [provider]  potassium chloride SA (KLOR-CON) 20 MEQ tablet Take 20 mEq by mouth daily.    [provider]  predniSONE (DELTASONE) 20 MG tablet Take 2 tablets (40 mg total) by mouth daily for 4 days. 11/30/21 12/04/21  Merlyn Lot, MD  sertraline (ZOLOFT) 100 MG tablet Take 150 mg by mouth daily.    [provider]  sucralfate (CARAFATE) 1 g tablet Take 1 g by mouth 3 (three) times daily before meals.    [provider]  traMADol (ULTRAM) 50 MG tablet Take 50 mg by mouth daily as needed. 10/06/21   [provider]  traZODone (DESYREL) 50 MG tablet Take 50 mg by mouth at bedtime.    [provider]  vitamin C (ASCORBIC ACID) 500 MG tablet Take 800 mg by mouth daily.    [provider]  ZINC OXIDE, TOPICAL, 10 % CREA Apply topically. Apply to buttock and sacrum every shift    [provider]    Allergies Patient has no known allergies.  Family History  Problem Relation Age of Onset   Diabetes Mother    Hypertension Father     Social History Social History   Tobacco Use   Smoking status: Former   Smokeless tobacco: Never  Scientific laboratory technician Use: Never used  Substance Use Topics   Alcohol use: Not Currently   Drug use: Not Currently    Review of Systems Constitutional: No fever. Eyes: No visual changes. ENT: No sore throat. Cardiovascular: Denies chest pain. Respiratory: Denies shortness of breath. Gastrointestinal: No nausea, vomiting, diarrhea. Genitourinary: Negative for dysuria. Musculoskeletal: Negative for back pain. Skin: Negative for rash. Neurological: Negative for focal weakness or  numbness.  ____________________________________________   PHYSICAL EXAM:  VITAL SIGNS: ED Triage Vitals  Enc Vitals Group     BP 11/30/21 0530 (!) 148/74     Pulse Rate 11/30/21 0530 71     Resp 11/30/21 0530 (!) 24     Temp 11/30/21 0533 97.9 F (36.6 C)     Temp Source 11/30/21 0533 Oral     SpO2 11/30/21 0530 98 %     Weight 11/30/21 0534 151 lb 14.4 oz (68.9 kg)     Height --      Head Circumference --      Peak Flow --      Pain Score --      Pain Loc --      Pain Edu? --      Excl. in [place]? --  CONSTITUTIONAL: Alert but extremely poor historian.  Elderly.  In no distress. HEAD: Normocephalic EYES: Conjunctivae clear, pupils appear equal, EOM appear intact ENT: normal nose; moist mucous membranes NECK: Supple, normal ROM CARD: RRR; S1 and S2 appreciated; no murmurs, no clicks, no rubs, no gallops RESP: Patient is slightly tachypneic.  Satting at 100% on 2 L nasal cannula which is her baseline.  Diminished aeration at her bases bilaterally but no rhonchi, wheezing or rales.  No respiratory distress or increased work of breathing. ABD/GI: Normal bowel sounds; non-distended; soft, non-tender, no rebound, no guarding, no peritoneal signs, no hepatosplenomegaly BACK: The back appears normal EXT: Normal ROM in all joints; no deformity noted, no edema; no cyanosis, no calf tenderness or calf swelling SKIN: Normal color for age and race; warm; no rash on exposed skin NEURO: Moves all extremities equally PSYCH: The patient's mood and manner are appropriate.  ____________________________________________   LABS (all labs ordered are listed, but only abnormal results are displayed)  Labs Reviewed  CBC WITH DIFFERENTIAL/PLATELET - Abnormal; Notable for the following components:      Result Value   RBC 3.57 (*)    Hemoglobin 9.8 (*)    HCT 33.0 (*)    MCHC 29.7 (*)    Abs Immature Granulocytes 0.10 (*)    All other components within normal limits  RESP PANEL BY RT-PCR  (FLU A&B, COVID) ARPGX2  BASIC METABOLIC PANEL  D-DIMER, QUANTITATIVE  TROPONIN I (HIGH SENSITIVITY)   ____________________________________________  EKG   EKG Interpretation  Date/Time:  Thursday November 30 2021 05:28:20 EST Ventricular Rate:  72 PR Interval:  167 QRS Duration: 103 QT Interval:  406 QTC Calculation: 445 R Axis:   7 Text Interpretation: Sinus rhythm Confirmed by Rochele Raring [phone]) on 11/30/2021 5:34:18 AM        ____________________________________________  RADIOLOGY Normajean Baxter Shahab Polhamus, personally viewed and evaluated these images (plain radiographs) as part of my medical decision making, as well as reviewing the written report by the radiologist.  ED MD interpretation: Chest x-ray shows findings consistent with acute bronchitis but no infiltrate or edema.  Official radiology report(s): DG Chest Portable 1 View  Result Date: 11/30/2021 CLINICAL DATA:  80 year old female with history of shortness of breath. EXAM: PORTABLE CHEST 1 VIEW COMPARISON:  Chest x-ray 11/29/2021. FINDINGS: Lung volumes are low. There are some ill-defined bibasilar opacities, similar to the prior study, favored to reflect areas of chronic scarring and/or atelectasis. No definite consolidative airspace disease. No pleural effusions. Diffuse peribronchial cuffing and patchy areas of interstitial prominence, similar to the prior study. No evidence of pulmonary edema. Heart size is normal. Moderate to large hiatal hernia. Upper mediastinal contours are otherwise unremarkable in appearance. Atherosclerotic calcifications in the thoracic aorta. IMPRESSION: 1. The appearance the chest is very similar to yesterday's examination, again demonstrating findings concerning for an acute bronchitis. 2. Aortic atherosclerosis. 3. Moderate to large hiatal hernia. Electronically Signed   By: Trudie Reed M.D.   On: 11/30/2021 06:25   DG Chest Portable 1 View  Result Date: 11/29/2021 CLINICAL DATA:   80 year old female with history of shortness of breath. EXAM: PORTABLE CHEST 1 VIEW COMPARISON:  Chest x-ray 08/13/2021. FINDINGS: Lung volumes are low. Widespread areas of interstitial prominence and diffuse peribronchial cuffing are noted. Chronic opacity in the base of the left hemithorax likely reflects chronic scarring. No definite acute consolidative airspace disease. No pleural effusions. No pneumothorax. No evidence of pulmonary edema. Heart size is normal. Moderate to large hiatal hernia.  Upper mediastinal contours are otherwise unremarkable. Atherosclerotic calcifications in the thoracic aorta. IMPRESSION: 1. Diffuse peribronchial cuffing and widespread interstitial prominence concerning for an acute bronchitis. 2. Aortic atherosclerosis. 3. Moderate to large hiatal hernia. Electronically Signed   By: Vinnie Langton M.D.   On: 11/29/2021 07:34    ____________________________________________   PROCEDURES  Procedure(s) performed (including Critical Care):  Procedures    ____________________________________________   INITIAL IMPRESSION / ASSESSMENT AND PLAN / ED COURSE  As part of my medical decision making, I reviewed the following data within the [place] notes reviewed and incorporated, Labs reviewed , EKG interpreted , Old EKG reviewed, Old chart reviewed, Radiograph reviewed , and Notes from prior ED visits         Patient here with complaints of shortness of breath.  Presented similarly yesterday with the same and was diagnosed with acute bronchitis and discharged with prednisone and doxycycline which do not appear to have been started yet by her nursing facility.  It appears she improved significantly after 1 DuoNeb treatment.  EKG today is nonischemic.  She denies any chest pain.  She is afebrile.  Slightly tachypneic but no respiratory distress.  Will repeat chest x-ray to ensure no significant change or development of pulmonary edema, pneumonia or  pneumothorax.  Will give DuoNeb and reassess.  This time I do not necessarily feel she needs repeat labs unless no improvement in her symptoms with DuoNeb.  Differential also includes PE, ACS.  ED PROGRESS  Repeat chest x-ray shows no significant change compared to yesterday.  Findings consistent with acute bronchitis.  Patient reports no improvement after DuoNeb treatment.  Will obtain cardiac labs and D-dimer.  Will give second treatment and Solu-Medrol.  6:58 AM  Pt's D-dimer elevated.  Will obtain CTA of her chest for further evaluation.  Signed out to oncoming ED physician.   I reviewed all nursing notes and pertinent previous records as available.  I have reviewed and interpreted any EKGs, lab and urine results, imaging (as available).  ____________________________________________   FINAL CLINICAL IMPRESSION(S) / ED DIAGNOSES  Final diagnoses:  SOB (shortness of breath)     ED Discharge Orders     None       *Please note:  Heather [name] was evaluated in Emergency Department on 11/30/2021 for the symptoms described in the history of present illness. She was evaluated in the context of the global COVID-19 pandemic, which necessitated consideration that the patient might be at risk for infection with the SARS-CoV-2 virus that causes COVID-19. Institutional protocols and algorithms that pertain to the evaluation of patients at risk for COVID-19 are in a state of rapid change based on information released by regulatory bodies including the CDC and federal and state organizations. These policies and algorithms were followed during the patient's care in the ED.  Some ED evaluations and interventions may be delayed as a result of limited staffing during and the pandemic.*   Note:  This document was prepared using Dragon voice recognition software and may include unintentional dictation errors.    Adryan Druckenmiller, Delice Bison, DO 11/30/21 [phone]

## 2021-11-30 NOTE — ED Triage Notes (Signed)
Pt in from [facility] via [facility] with sob per staff. Seen in ED yesterday for same, and sent home with Doxy and Prednisone for COPD exacerbation. A&ox2, denies any cp, sats 99% on 2.5LNC.

## 2021-12-05 DIAGNOSIS — Z792 Long term (current) use of antibiotics: Secondary | ICD-10-CM | POA: Diagnosis not present

## 2021-12-05 DIAGNOSIS — K219 Gastro-esophageal reflux disease without esophagitis: Secondary | ICD-10-CM | POA: Diagnosis not present

## 2021-12-05 DIAGNOSIS — I10 Essential (primary) hypertension: Secondary | ICD-10-CM | POA: Diagnosis not present

## 2021-12-05 DIAGNOSIS — Z9981 Dependence on supplemental oxygen: Secondary | ICD-10-CM | POA: Diagnosis not present

## 2021-12-05 DIAGNOSIS — K5900 Constipation, unspecified: Secondary | ICD-10-CM | POA: Diagnosis not present

## 2021-12-05 DIAGNOSIS — J209 Acute bronchitis, unspecified: Secondary | ICD-10-CM | POA: Diagnosis not present

## 2021-12-05 DIAGNOSIS — Z9181 History of falling: Secondary | ICD-10-CM | POA: Diagnosis not present

## 2021-12-05 DIAGNOSIS — J441 Chronic obstructive pulmonary disease with (acute) exacerbation: Secondary | ICD-10-CM | POA: Diagnosis not present

## 2021-12-05 DIAGNOSIS — E785 Hyperlipidemia, unspecified: Secondary | ICD-10-CM | POA: Diagnosis not present

## 2021-12-07 DIAGNOSIS — Z9181 History of falling: Secondary | ICD-10-CM | POA: Diagnosis not present

## 2021-12-07 DIAGNOSIS — Z9981 Dependence on supplemental oxygen: Secondary | ICD-10-CM | POA: Diagnosis not present

## 2021-12-07 DIAGNOSIS — K219 Gastro-esophageal reflux disease without esophagitis: Secondary | ICD-10-CM | POA: Diagnosis not present

## 2021-12-07 DIAGNOSIS — J441 Chronic obstructive pulmonary disease with (acute) exacerbation: Secondary | ICD-10-CM | POA: Diagnosis not present

## 2021-12-07 DIAGNOSIS — I10 Essential (primary) hypertension: Secondary | ICD-10-CM | POA: Diagnosis not present

## 2021-12-07 DIAGNOSIS — E785 Hyperlipidemia, unspecified: Secondary | ICD-10-CM | POA: Diagnosis not present

## 2021-12-07 DIAGNOSIS — K5900 Constipation, unspecified: Secondary | ICD-10-CM | POA: Diagnosis not present

## 2021-12-07 DIAGNOSIS — Z792 Long term (current) use of antibiotics: Secondary | ICD-10-CM | POA: Diagnosis not present

## 2021-12-07 DIAGNOSIS — J209 Acute bronchitis, unspecified: Secondary | ICD-10-CM | POA: Diagnosis not present

## 2021-12-11 DIAGNOSIS — J209 Acute bronchitis, unspecified: Secondary | ICD-10-CM | POA: Diagnosis not present

## 2021-12-11 DIAGNOSIS — Z792 Long term (current) use of antibiotics: Secondary | ICD-10-CM | POA: Diagnosis not present

## 2021-12-11 DIAGNOSIS — I10 Essential (primary) hypertension: Secondary | ICD-10-CM | POA: Diagnosis not present

## 2021-12-11 DIAGNOSIS — J441 Chronic obstructive pulmonary disease with (acute) exacerbation: Secondary | ICD-10-CM | POA: Diagnosis not present

## 2021-12-11 DIAGNOSIS — K219 Gastro-esophageal reflux disease without esophagitis: Secondary | ICD-10-CM | POA: Diagnosis not present

## 2021-12-11 DIAGNOSIS — Z9981 Dependence on supplemental oxygen: Secondary | ICD-10-CM | POA: Diagnosis not present

## 2021-12-11 DIAGNOSIS — K5900 Constipation, unspecified: Secondary | ICD-10-CM | POA: Diagnosis not present

## 2021-12-11 DIAGNOSIS — Z9181 History of falling: Secondary | ICD-10-CM | POA: Diagnosis not present

## 2021-12-11 DIAGNOSIS — E785 Hyperlipidemia, unspecified: Secondary | ICD-10-CM | POA: Diagnosis not present

## 2021-12-13 DIAGNOSIS — I10 Essential (primary) hypertension: Secondary | ICD-10-CM | POA: Diagnosis not present

## 2021-12-13 DIAGNOSIS — R269 Unspecified abnormalities of gait and mobility: Secondary | ICD-10-CM | POA: Diagnosis not present

## 2021-12-13 DIAGNOSIS — Z9981 Dependence on supplemental oxygen: Secondary | ICD-10-CM | POA: Diagnosis not present

## 2021-12-13 DIAGNOSIS — K219 Gastro-esophageal reflux disease without esophagitis: Secondary | ICD-10-CM | POA: Diagnosis not present

## 2021-12-13 DIAGNOSIS — J441 Chronic obstructive pulmonary disease with (acute) exacerbation: Secondary | ICD-10-CM | POA: Diagnosis not present

## 2021-12-13 DIAGNOSIS — N182 Chronic kidney disease, stage 2 (mild): Secondary | ICD-10-CM | POA: Diagnosis not present

## 2021-12-13 DIAGNOSIS — Z792 Long term (current) use of antibiotics: Secondary | ICD-10-CM | POA: Diagnosis not present

## 2021-12-13 DIAGNOSIS — Z993 Dependence on wheelchair: Secondary | ICD-10-CM | POA: Diagnosis not present

## 2021-12-13 DIAGNOSIS — J209 Acute bronchitis, unspecified: Secondary | ICD-10-CM | POA: Diagnosis not present

## 2021-12-13 DIAGNOSIS — I129 Hypertensive chronic kidney disease with stage 1 through stage 4 chronic kidney disease, or unspecified chronic kidney disease: Secondary | ICD-10-CM | POA: Diagnosis not present

## 2021-12-13 DIAGNOSIS — Z9181 History of falling: Secondary | ICD-10-CM | POA: Diagnosis not present

## 2021-12-13 DIAGNOSIS — E785 Hyperlipidemia, unspecified: Secondary | ICD-10-CM | POA: Diagnosis not present

## 2021-12-13 DIAGNOSIS — D649 Anemia, unspecified: Secondary | ICD-10-CM | POA: Diagnosis not present

## 2021-12-13 DIAGNOSIS — J449 Chronic obstructive pulmonary disease, unspecified: Secondary | ICD-10-CM | POA: Diagnosis not present

## 2021-12-13 DIAGNOSIS — K5900 Constipation, unspecified: Secondary | ICD-10-CM | POA: Diagnosis not present

## 2021-12-19 DIAGNOSIS — K5900 Constipation, unspecified: Secondary | ICD-10-CM | POA: Diagnosis not present

## 2021-12-19 DIAGNOSIS — K219 Gastro-esophageal reflux disease without esophagitis: Secondary | ICD-10-CM | POA: Diagnosis not present

## 2021-12-19 DIAGNOSIS — Z9181 History of falling: Secondary | ICD-10-CM | POA: Diagnosis not present

## 2021-12-19 DIAGNOSIS — J209 Acute bronchitis, unspecified: Secondary | ICD-10-CM | POA: Diagnosis not present

## 2021-12-19 DIAGNOSIS — Z792 Long term (current) use of antibiotics: Secondary | ICD-10-CM | POA: Diagnosis not present

## 2021-12-19 DIAGNOSIS — Z9981 Dependence on supplemental oxygen: Secondary | ICD-10-CM | POA: Diagnosis not present

## 2021-12-19 DIAGNOSIS — I10 Essential (primary) hypertension: Secondary | ICD-10-CM | POA: Diagnosis not present

## 2021-12-19 DIAGNOSIS — J441 Chronic obstructive pulmonary disease with (acute) exacerbation: Secondary | ICD-10-CM | POA: Diagnosis not present

## 2021-12-19 DIAGNOSIS — E785 Hyperlipidemia, unspecified: Secondary | ICD-10-CM | POA: Diagnosis not present

## 2021-12-20 DIAGNOSIS — Z9981 Dependence on supplemental oxygen: Secondary | ICD-10-CM | POA: Diagnosis not present

## 2021-12-20 DIAGNOSIS — I10 Essential (primary) hypertension: Secondary | ICD-10-CM | POA: Diagnosis not present

## 2021-12-20 DIAGNOSIS — E785 Hyperlipidemia, unspecified: Secondary | ICD-10-CM | POA: Diagnosis not present

## 2021-12-20 DIAGNOSIS — K219 Gastro-esophageal reflux disease without esophagitis: Secondary | ICD-10-CM | POA: Diagnosis not present

## 2021-12-20 DIAGNOSIS — J209 Acute bronchitis, unspecified: Secondary | ICD-10-CM | POA: Diagnosis not present

## 2021-12-20 DIAGNOSIS — K5900 Constipation, unspecified: Secondary | ICD-10-CM | POA: Diagnosis not present

## 2021-12-20 DIAGNOSIS — J441 Chronic obstructive pulmonary disease with (acute) exacerbation: Secondary | ICD-10-CM | POA: Diagnosis not present

## 2021-12-20 DIAGNOSIS — Z9181 History of falling: Secondary | ICD-10-CM | POA: Diagnosis not present

## 2021-12-20 DIAGNOSIS — Z792 Long term (current) use of antibiotics: Secondary | ICD-10-CM | POA: Diagnosis not present

## 2021-12-26 DIAGNOSIS — Z792 Long term (current) use of antibiotics: Secondary | ICD-10-CM | POA: Diagnosis not present

## 2021-12-26 DIAGNOSIS — I10 Essential (primary) hypertension: Secondary | ICD-10-CM | POA: Diagnosis not present

## 2021-12-26 DIAGNOSIS — K219 Gastro-esophageal reflux disease without esophagitis: Secondary | ICD-10-CM | POA: Diagnosis not present

## 2021-12-26 DIAGNOSIS — S6290XA Unspecified fracture of unspecified wrist and hand, initial encounter for closed fracture: Secondary | ICD-10-CM | POA: Diagnosis not present

## 2021-12-26 DIAGNOSIS — J209 Acute bronchitis, unspecified: Secondary | ICD-10-CM | POA: Diagnosis not present

## 2021-12-26 DIAGNOSIS — Z9181 History of falling: Secondary | ICD-10-CM | POA: Diagnosis not present

## 2021-12-26 DIAGNOSIS — Z9981 Dependence on supplemental oxygen: Secondary | ICD-10-CM | POA: Diagnosis not present

## 2021-12-26 DIAGNOSIS — E785 Hyperlipidemia, unspecified: Secondary | ICD-10-CM | POA: Diagnosis not present

## 2021-12-26 DIAGNOSIS — K5900 Constipation, unspecified: Secondary | ICD-10-CM | POA: Diagnosis not present

## 2021-12-26 DIAGNOSIS — J441 Chronic obstructive pulmonary disease with (acute) exacerbation: Secondary | ICD-10-CM | POA: Diagnosis not present

## 2021-12-26 DIAGNOSIS — Z5189 Encounter for other specified aftercare: Secondary | ICD-10-CM | POA: Diagnosis not present

## 2021-12-26 DIAGNOSIS — S0990XD Unspecified injury of head, subsequent encounter: Secondary | ICD-10-CM | POA: Diagnosis not present

## 2021-12-27 ENCOUNTER — Other Ambulatory Visit: Payer: Self-pay

## 2021-12-27 ENCOUNTER — Non-Acute Institutional Stay: Payer: Medicare Other | Admitting: Student

## 2021-12-27 DIAGNOSIS — J209 Acute bronchitis, unspecified: Secondary | ICD-10-CM | POA: Diagnosis not present

## 2021-12-27 DIAGNOSIS — E785 Hyperlipidemia, unspecified: Secondary | ICD-10-CM | POA: Diagnosis not present

## 2021-12-27 DIAGNOSIS — Z9181 History of falling: Secondary | ICD-10-CM | POA: Diagnosis not present

## 2021-12-27 DIAGNOSIS — Z515 Encounter for palliative care: Secondary | ICD-10-CM | POA: Diagnosis not present

## 2021-12-27 DIAGNOSIS — R52 Pain, unspecified: Secondary | ICD-10-CM | POA: Diagnosis not present

## 2021-12-27 DIAGNOSIS — R0602 Shortness of breath: Secondary | ICD-10-CM | POA: Diagnosis not present

## 2021-12-27 DIAGNOSIS — K219 Gastro-esophageal reflux disease without esophagitis: Secondary | ICD-10-CM | POA: Diagnosis not present

## 2021-12-27 DIAGNOSIS — Z9981 Dependence on supplemental oxygen: Secondary | ICD-10-CM | POA: Diagnosis not present

## 2021-12-27 DIAGNOSIS — Z792 Long term (current) use of antibiotics: Secondary | ICD-10-CM | POA: Diagnosis not present

## 2021-12-27 DIAGNOSIS — K5900 Constipation, unspecified: Secondary | ICD-10-CM | POA: Diagnosis not present

## 2021-12-27 DIAGNOSIS — I10 Essential (primary) hypertension: Secondary | ICD-10-CM | POA: Diagnosis not present

## 2021-12-27 DIAGNOSIS — J441 Chronic obstructive pulmonary disease with (acute) exacerbation: Secondary | ICD-10-CM | POA: Diagnosis not present

## 2021-12-27 NOTE — Progress Notes (Signed)
Therapist, nutritional Palliative Care Consult Note Telephone: [phone]  Fax: [phone]    Date of encounter: 12/27/21  PATIENT NAME: Heather [name] [address] [place] [address]   [phone] (home)  DOB: March 11, 1941 MRN: [id number] PRIMARY CARE PROVIDER:    [facility],  [address] [place] [address] [phone]  REFERRING PROVIDER:   [facility] [address] [place],  [place] [address] [phone]  RESPONSIBLE PARTY:    Contact Information     Name Relation Home Work [place] Son   [phone]   Albertine Grates   [phone]        I met face to face with patient in the facility. Palliative Care was asked to follow this patient by consultation request of  [facility] to address advance care planning and complex medical decision making. This is a follow up visit.                                   ASSESSMENT AND PLAN / RECOMMENDATIONS:   Advance Care Planning/Goals of Care: Goals include to maximize quality of life and symptom management.   CODE STATUS: DNR  Symptom Management/Plan:  Shortness of breath-secondary to COPD, chronic respiratory failure with hypoxia. Continue nebulizer PRN as directed, oxygen PRN at 2 lpm.   Pain-back pain and left hip pain. Continue lidocaine 4% patch to left hip, acetaminophen, PRN tramadol as directed.  Follow up Palliative Care Visit: Palliative care will continue to follow for complex medical decision making, advance care planning, and clarification of goals. Return in 8 weeks or prn.  This visit was coded based on medical decision making (MDM).  PPS: 40%  HOSPICE ELIGIBILITY/DIAGNOSIS: TBD  Chief Complaint: Palliative Medicine follow up visit.   HISTORY OF PRESENT ILLNESS:  [name] is a 81 y.o. year old female  with COPD, anxiety, anemia, dementia, GERD, major depressive  disorder, chronic pain, hypertension, macular degeneration. Patient with ED visits on 12/7 and 12/8 due to SOB, COPD exacerbation; 11/14/21 due to fall.  Patient resides at [facility]. Patient reports feeling constipated; having small bowel movements. She denies pain at present; does report back occasional back pain and left hip pain. She requires assist x 1 for transfers. Fair appetite; she eats in her room. She endorses shortness of breath with exertion; denies dyspnea at rest. She is wearing oxygen at 2 lpm via nasal canula continuously. She reports sleeping okay at night; naps intermittently during the day. A 10-point review of systems is negative, except for the pertinent positives and negatives detailed in the HPI.   History obtained from review of EMR, discussion with primary team, and interview with family, facility staff/caregiver and/or Ms. Mccoy.  I reviewed available labs, medications, imaging, studies and related documents from the EMR.  Records reviewed and summarized above.    Physical Exam: Pulse 60, resp 20, b/p 130/80, sats 95% at 2 lpm Constitutional: NAD General: frail appearing, thin EYES: anicteric sclera, lids intact, no discharge  ENMT: slight hard of hearing, oral mucous membranes moist CV: S1S2, RRR, no LE edema Pulmonary: Lungs clear, bases slightly diminished, no adventitious sounds, no increased work of breathing, no cough Abdomen: normo-active BS + 4 quadrants, soft and non tender, no ascites GU: deferred MSK: sarcopenia, moves all extremities,non- ambulatory Skin: warm and dry, no rashes or wounds on visible  skin Neuro: generalized weakness, A & O x 3 Psych: non-anxious affect, pleasant Hem/lymph/immuno: no widespread bruising   Thank you for the opportunity to participate in the care of Ms. Giaimo.  The palliative care team will continue to follow. Please call our office at [phone] if we can be of additional assistance.   Ezekiel Slocumb, NP   COVID-19  PATIENT SCREENING TOOL Asked and negative response unless otherwise noted:   Have you had symptoms of covid, tested positive or been in contact with someone with symptoms/positive test in the past 5-10 days?

## 2022-01-01 DIAGNOSIS — Z9181 History of falling: Secondary | ICD-10-CM | POA: Diagnosis not present

## 2022-01-01 DIAGNOSIS — K219 Gastro-esophageal reflux disease without esophagitis: Secondary | ICD-10-CM | POA: Diagnosis not present

## 2022-01-01 DIAGNOSIS — Z9981 Dependence on supplemental oxygen: Secondary | ICD-10-CM | POA: Diagnosis not present

## 2022-01-01 DIAGNOSIS — I10 Essential (primary) hypertension: Secondary | ICD-10-CM | POA: Diagnosis not present

## 2022-01-01 DIAGNOSIS — J209 Acute bronchitis, unspecified: Secondary | ICD-10-CM | POA: Diagnosis not present

## 2022-01-01 DIAGNOSIS — E785 Hyperlipidemia, unspecified: Secondary | ICD-10-CM | POA: Diagnosis not present

## 2022-01-01 DIAGNOSIS — K5900 Constipation, unspecified: Secondary | ICD-10-CM | POA: Diagnosis not present

## 2022-01-01 DIAGNOSIS — J441 Chronic obstructive pulmonary disease with (acute) exacerbation: Secondary | ICD-10-CM | POA: Diagnosis not present

## 2022-01-01 DIAGNOSIS — Z792 Long term (current) use of antibiotics: Secondary | ICD-10-CM | POA: Diagnosis not present

## 2022-01-03 DIAGNOSIS — Z9981 Dependence on supplemental oxygen: Secondary | ICD-10-CM | POA: Diagnosis not present

## 2022-01-03 DIAGNOSIS — J209 Acute bronchitis, unspecified: Secondary | ICD-10-CM | POA: Diagnosis not present

## 2022-01-03 DIAGNOSIS — J441 Chronic obstructive pulmonary disease with (acute) exacerbation: Secondary | ICD-10-CM | POA: Diagnosis not present

## 2022-01-03 DIAGNOSIS — Z9181 History of falling: Secondary | ICD-10-CM | POA: Diagnosis not present

## 2022-01-03 DIAGNOSIS — Z792 Long term (current) use of antibiotics: Secondary | ICD-10-CM | POA: Diagnosis not present

## 2022-01-03 DIAGNOSIS — K5900 Constipation, unspecified: Secondary | ICD-10-CM | POA: Diagnosis not present

## 2022-01-03 DIAGNOSIS — I10 Essential (primary) hypertension: Secondary | ICD-10-CM | POA: Diagnosis not present

## 2022-01-03 DIAGNOSIS — E785 Hyperlipidemia, unspecified: Secondary | ICD-10-CM | POA: Diagnosis not present

## 2022-01-03 DIAGNOSIS — K219 Gastro-esophageal reflux disease without esophagitis: Secondary | ICD-10-CM | POA: Diagnosis not present

## 2022-01-08 DIAGNOSIS — E785 Hyperlipidemia, unspecified: Secondary | ICD-10-CM | POA: Diagnosis not present

## 2022-01-08 DIAGNOSIS — I10 Essential (primary) hypertension: Secondary | ICD-10-CM | POA: Diagnosis not present

## 2022-01-08 DIAGNOSIS — K219 Gastro-esophageal reflux disease without esophagitis: Secondary | ICD-10-CM | POA: Diagnosis not present

## 2022-01-08 DIAGNOSIS — Z9981 Dependence on supplemental oxygen: Secondary | ICD-10-CM | POA: Diagnosis not present

## 2022-01-08 DIAGNOSIS — J209 Acute bronchitis, unspecified: Secondary | ICD-10-CM | POA: Diagnosis not present

## 2022-01-08 DIAGNOSIS — K5900 Constipation, unspecified: Secondary | ICD-10-CM | POA: Diagnosis not present

## 2022-01-08 DIAGNOSIS — Z792 Long term (current) use of antibiotics: Secondary | ICD-10-CM | POA: Diagnosis not present

## 2022-01-08 DIAGNOSIS — Z9181 History of falling: Secondary | ICD-10-CM | POA: Diagnosis not present

## 2022-01-08 DIAGNOSIS — J441 Chronic obstructive pulmonary disease with (acute) exacerbation: Secondary | ICD-10-CM | POA: Diagnosis not present

## 2022-01-09 DIAGNOSIS — Z9181 History of falling: Secondary | ICD-10-CM | POA: Diagnosis not present

## 2022-01-09 DIAGNOSIS — E785 Hyperlipidemia, unspecified: Secondary | ICD-10-CM | POA: Diagnosis not present

## 2022-01-09 DIAGNOSIS — K5900 Constipation, unspecified: Secondary | ICD-10-CM | POA: Diagnosis not present

## 2022-01-09 DIAGNOSIS — Z9981 Dependence on supplemental oxygen: Secondary | ICD-10-CM | POA: Diagnosis not present

## 2022-01-09 DIAGNOSIS — I10 Essential (primary) hypertension: Secondary | ICD-10-CM | POA: Diagnosis not present

## 2022-01-09 DIAGNOSIS — K219 Gastro-esophageal reflux disease without esophagitis: Secondary | ICD-10-CM | POA: Diagnosis not present

## 2022-01-09 DIAGNOSIS — Z792 Long term (current) use of antibiotics: Secondary | ICD-10-CM | POA: Diagnosis not present

## 2022-01-09 DIAGNOSIS — J209 Acute bronchitis, unspecified: Secondary | ICD-10-CM | POA: Diagnosis not present

## 2022-01-09 DIAGNOSIS — J441 Chronic obstructive pulmonary disease with (acute) exacerbation: Secondary | ICD-10-CM | POA: Diagnosis not present

## 2022-01-10 DIAGNOSIS — J449 Chronic obstructive pulmonary disease, unspecified: Secondary | ICD-10-CM | POA: Diagnosis not present

## 2022-01-10 DIAGNOSIS — Z993 Dependence on wheelchair: Secondary | ICD-10-CM | POA: Diagnosis not present

## 2022-01-10 DIAGNOSIS — R269 Unspecified abnormalities of gait and mobility: Secondary | ICD-10-CM | POA: Diagnosis not present

## 2022-01-10 DIAGNOSIS — I129 Hypertensive chronic kidney disease with stage 1 through stage 4 chronic kidney disease, or unspecified chronic kidney disease: Secondary | ICD-10-CM | POA: Diagnosis not present

## 2022-01-10 DIAGNOSIS — N182 Chronic kidney disease, stage 2 (mild): Secondary | ICD-10-CM | POA: Diagnosis not present

## 2022-01-10 DIAGNOSIS — K5901 Slow transit constipation: Secondary | ICD-10-CM | POA: Diagnosis not present

## 2022-01-11 DIAGNOSIS — Z723 Lack of physical exercise: Secondary | ICD-10-CM | POA: Diagnosis not present

## 2022-01-11 DIAGNOSIS — D509 Iron deficiency anemia, unspecified: Secondary | ICD-10-CM | POA: Diagnosis not present

## 2022-01-11 DIAGNOSIS — J449 Chronic obstructive pulmonary disease, unspecified: Secondary | ICD-10-CM | POA: Diagnosis not present

## 2022-01-11 DIAGNOSIS — Z9981 Dependence on supplemental oxygen: Secondary | ICD-10-CM | POA: Diagnosis not present

## 2022-01-15 DIAGNOSIS — J441 Chronic obstructive pulmonary disease with (acute) exacerbation: Secondary | ICD-10-CM | POA: Diagnosis not present

## 2022-01-15 DIAGNOSIS — Z9181 History of falling: Secondary | ICD-10-CM | POA: Diagnosis not present

## 2022-01-15 DIAGNOSIS — K5900 Constipation, unspecified: Secondary | ICD-10-CM | POA: Diagnosis not present

## 2022-01-15 DIAGNOSIS — E785 Hyperlipidemia, unspecified: Secondary | ICD-10-CM | POA: Diagnosis not present

## 2022-01-15 DIAGNOSIS — I10 Essential (primary) hypertension: Secondary | ICD-10-CM | POA: Diagnosis not present

## 2022-01-15 DIAGNOSIS — K219 Gastro-esophageal reflux disease without esophagitis: Secondary | ICD-10-CM | POA: Diagnosis not present

## 2022-01-15 DIAGNOSIS — Z9981 Dependence on supplemental oxygen: Secondary | ICD-10-CM | POA: Diagnosis not present

## 2022-01-15 DIAGNOSIS — Z792 Long term (current) use of antibiotics: Secondary | ICD-10-CM | POA: Diagnosis not present

## 2022-01-15 DIAGNOSIS — J209 Acute bronchitis, unspecified: Secondary | ICD-10-CM | POA: Diagnosis not present

## 2022-01-16 DIAGNOSIS — Z9181 History of falling: Secondary | ICD-10-CM | POA: Diagnosis not present

## 2022-01-16 DIAGNOSIS — I10 Essential (primary) hypertension: Secondary | ICD-10-CM | POA: Diagnosis not present

## 2022-01-16 DIAGNOSIS — Z9981 Dependence on supplemental oxygen: Secondary | ICD-10-CM | POA: Diagnosis not present

## 2022-01-16 DIAGNOSIS — E785 Hyperlipidemia, unspecified: Secondary | ICD-10-CM | POA: Diagnosis not present

## 2022-01-16 DIAGNOSIS — Z792 Long term (current) use of antibiotics: Secondary | ICD-10-CM | POA: Diagnosis not present

## 2022-01-16 DIAGNOSIS — J209 Acute bronchitis, unspecified: Secondary | ICD-10-CM | POA: Diagnosis not present

## 2022-01-16 DIAGNOSIS — K219 Gastro-esophageal reflux disease without esophagitis: Secondary | ICD-10-CM | POA: Diagnosis not present

## 2022-01-16 DIAGNOSIS — J441 Chronic obstructive pulmonary disease with (acute) exacerbation: Secondary | ICD-10-CM | POA: Diagnosis not present

## 2022-01-16 DIAGNOSIS — K5900 Constipation, unspecified: Secondary | ICD-10-CM | POA: Diagnosis not present

## 2022-01-18 DIAGNOSIS — K5900 Constipation, unspecified: Secondary | ICD-10-CM | POA: Diagnosis not present

## 2022-01-18 DIAGNOSIS — J209 Acute bronchitis, unspecified: Secondary | ICD-10-CM | POA: Diagnosis not present

## 2022-01-18 DIAGNOSIS — J441 Chronic obstructive pulmonary disease with (acute) exacerbation: Secondary | ICD-10-CM | POA: Diagnosis not present

## 2022-01-18 DIAGNOSIS — Z9181 History of falling: Secondary | ICD-10-CM | POA: Diagnosis not present

## 2022-01-18 DIAGNOSIS — E785 Hyperlipidemia, unspecified: Secondary | ICD-10-CM | POA: Diagnosis not present

## 2022-01-18 DIAGNOSIS — Z9981 Dependence on supplemental oxygen: Secondary | ICD-10-CM | POA: Diagnosis not present

## 2022-01-18 DIAGNOSIS — I10 Essential (primary) hypertension: Secondary | ICD-10-CM | POA: Diagnosis not present

## 2022-01-18 DIAGNOSIS — Z792 Long term (current) use of antibiotics: Secondary | ICD-10-CM | POA: Diagnosis not present

## 2022-01-18 DIAGNOSIS — K219 Gastro-esophageal reflux disease without esophagitis: Secondary | ICD-10-CM | POA: Diagnosis not present

## 2022-01-21 DIAGNOSIS — J209 Acute bronchitis, unspecified: Secondary | ICD-10-CM | POA: Diagnosis not present

## 2022-01-21 DIAGNOSIS — E785 Hyperlipidemia, unspecified: Secondary | ICD-10-CM | POA: Diagnosis not present

## 2022-01-21 DIAGNOSIS — Z792 Long term (current) use of antibiotics: Secondary | ICD-10-CM | POA: Diagnosis not present

## 2022-01-21 DIAGNOSIS — Z9181 History of falling: Secondary | ICD-10-CM | POA: Diagnosis not present

## 2022-01-21 DIAGNOSIS — K5900 Constipation, unspecified: Secondary | ICD-10-CM | POA: Diagnosis not present

## 2022-01-21 DIAGNOSIS — Z9981 Dependence on supplemental oxygen: Secondary | ICD-10-CM | POA: Diagnosis not present

## 2022-01-21 DIAGNOSIS — I10 Essential (primary) hypertension: Secondary | ICD-10-CM | POA: Diagnosis not present

## 2022-01-21 DIAGNOSIS — K219 Gastro-esophageal reflux disease without esophagitis: Secondary | ICD-10-CM | POA: Diagnosis not present

## 2022-01-21 DIAGNOSIS — J441 Chronic obstructive pulmonary disease with (acute) exacerbation: Secondary | ICD-10-CM | POA: Diagnosis not present

## 2022-01-22 DIAGNOSIS — K5900 Constipation, unspecified: Secondary | ICD-10-CM | POA: Diagnosis not present

## 2022-01-22 DIAGNOSIS — I10 Essential (primary) hypertension: Secondary | ICD-10-CM | POA: Diagnosis not present

## 2022-01-22 DIAGNOSIS — Z9981 Dependence on supplemental oxygen: Secondary | ICD-10-CM | POA: Diagnosis not present

## 2022-01-22 DIAGNOSIS — J209 Acute bronchitis, unspecified: Secondary | ICD-10-CM | POA: Diagnosis not present

## 2022-01-22 DIAGNOSIS — K219 Gastro-esophageal reflux disease without esophagitis: Secondary | ICD-10-CM | POA: Diagnosis not present

## 2022-01-22 DIAGNOSIS — J441 Chronic obstructive pulmonary disease with (acute) exacerbation: Secondary | ICD-10-CM | POA: Diagnosis not present

## 2022-01-22 DIAGNOSIS — E785 Hyperlipidemia, unspecified: Secondary | ICD-10-CM | POA: Diagnosis not present

## 2022-01-22 DIAGNOSIS — Z9181 History of falling: Secondary | ICD-10-CM | POA: Diagnosis not present

## 2022-01-22 DIAGNOSIS — Z792 Long term (current) use of antibiotics: Secondary | ICD-10-CM | POA: Diagnosis not present

## 2022-01-23 DIAGNOSIS — K219 Gastro-esophageal reflux disease without esophagitis: Secondary | ICD-10-CM | POA: Diagnosis not present

## 2022-01-23 DIAGNOSIS — J441 Chronic obstructive pulmonary disease with (acute) exacerbation: Secondary | ICD-10-CM | POA: Diagnosis not present

## 2022-01-23 DIAGNOSIS — I10 Essential (primary) hypertension: Secondary | ICD-10-CM | POA: Diagnosis not present

## 2022-01-23 DIAGNOSIS — K5900 Constipation, unspecified: Secondary | ICD-10-CM | POA: Diagnosis not present

## 2022-01-23 DIAGNOSIS — E785 Hyperlipidemia, unspecified: Secondary | ICD-10-CM | POA: Diagnosis not present

## 2022-01-23 DIAGNOSIS — Z9981 Dependence on supplemental oxygen: Secondary | ICD-10-CM | POA: Diagnosis not present

## 2022-01-23 DIAGNOSIS — Z792 Long term (current) use of antibiotics: Secondary | ICD-10-CM | POA: Diagnosis not present

## 2022-01-23 DIAGNOSIS — Z9181 History of falling: Secondary | ICD-10-CM | POA: Diagnosis not present

## 2022-01-23 DIAGNOSIS — J209 Acute bronchitis, unspecified: Secondary | ICD-10-CM | POA: Diagnosis not present

## 2022-01-25 DIAGNOSIS — J209 Acute bronchitis, unspecified: Secondary | ICD-10-CM | POA: Diagnosis not present

## 2022-01-25 DIAGNOSIS — K219 Gastro-esophageal reflux disease without esophagitis: Secondary | ICD-10-CM | POA: Diagnosis not present

## 2022-01-25 DIAGNOSIS — K5900 Constipation, unspecified: Secondary | ICD-10-CM | POA: Diagnosis not present

## 2022-01-25 DIAGNOSIS — E785 Hyperlipidemia, unspecified: Secondary | ICD-10-CM | POA: Diagnosis not present

## 2022-01-25 DIAGNOSIS — I10 Essential (primary) hypertension: Secondary | ICD-10-CM | POA: Diagnosis not present

## 2022-01-25 DIAGNOSIS — J441 Chronic obstructive pulmonary disease with (acute) exacerbation: Secondary | ICD-10-CM | POA: Diagnosis not present

## 2022-01-25 DIAGNOSIS — Z792 Long term (current) use of antibiotics: Secondary | ICD-10-CM | POA: Diagnosis not present

## 2022-01-25 DIAGNOSIS — Z9181 History of falling: Secondary | ICD-10-CM | POA: Diagnosis not present

## 2022-01-25 DIAGNOSIS — Z9981 Dependence on supplemental oxygen: Secondary | ICD-10-CM | POA: Diagnosis not present

## 2022-01-26 DIAGNOSIS — Z5189 Encounter for other specified aftercare: Secondary | ICD-10-CM | POA: Diagnosis not present

## 2022-01-26 DIAGNOSIS — S0990XD Unspecified injury of head, subsequent encounter: Secondary | ICD-10-CM | POA: Diagnosis not present

## 2022-01-26 DIAGNOSIS — J441 Chronic obstructive pulmonary disease with (acute) exacerbation: Secondary | ICD-10-CM | POA: Diagnosis not present

## 2022-01-26 DIAGNOSIS — S6290XA Unspecified fracture of unspecified wrist and hand, initial encounter for closed fracture: Secondary | ICD-10-CM | POA: Diagnosis not present

## 2022-01-30 DIAGNOSIS — Z9181 History of falling: Secondary | ICD-10-CM | POA: Diagnosis not present

## 2022-01-30 DIAGNOSIS — K5900 Constipation, unspecified: Secondary | ICD-10-CM | POA: Diagnosis not present

## 2022-01-30 DIAGNOSIS — J441 Chronic obstructive pulmonary disease with (acute) exacerbation: Secondary | ICD-10-CM | POA: Diagnosis not present

## 2022-01-30 DIAGNOSIS — J209 Acute bronchitis, unspecified: Secondary | ICD-10-CM | POA: Diagnosis not present

## 2022-01-30 DIAGNOSIS — E785 Hyperlipidemia, unspecified: Secondary | ICD-10-CM | POA: Diagnosis not present

## 2022-01-30 DIAGNOSIS — Z792 Long term (current) use of antibiotics: Secondary | ICD-10-CM | POA: Diagnosis not present

## 2022-01-30 DIAGNOSIS — I10 Essential (primary) hypertension: Secondary | ICD-10-CM | POA: Diagnosis not present

## 2022-01-30 DIAGNOSIS — K219 Gastro-esophageal reflux disease without esophagitis: Secondary | ICD-10-CM | POA: Diagnosis not present

## 2022-01-30 DIAGNOSIS — Z9981 Dependence on supplemental oxygen: Secondary | ICD-10-CM | POA: Diagnosis not present

## 2022-02-01 DIAGNOSIS — J441 Chronic obstructive pulmonary disease with (acute) exacerbation: Secondary | ICD-10-CM | POA: Diagnosis not present

## 2022-02-01 DIAGNOSIS — Z792 Long term (current) use of antibiotics: Secondary | ICD-10-CM | POA: Diagnosis not present

## 2022-02-01 DIAGNOSIS — Z9181 History of falling: Secondary | ICD-10-CM | POA: Diagnosis not present

## 2022-02-01 DIAGNOSIS — K219 Gastro-esophageal reflux disease without esophagitis: Secondary | ICD-10-CM | POA: Diagnosis not present

## 2022-02-01 DIAGNOSIS — Z9981 Dependence on supplemental oxygen: Secondary | ICD-10-CM | POA: Diagnosis not present

## 2022-02-01 DIAGNOSIS — I10 Essential (primary) hypertension: Secondary | ICD-10-CM | POA: Diagnosis not present

## 2022-02-01 DIAGNOSIS — E785 Hyperlipidemia, unspecified: Secondary | ICD-10-CM | POA: Diagnosis not present

## 2022-02-01 DIAGNOSIS — J209 Acute bronchitis, unspecified: Secondary | ICD-10-CM | POA: Diagnosis not present

## 2022-02-01 DIAGNOSIS — K5900 Constipation, unspecified: Secondary | ICD-10-CM | POA: Diagnosis not present

## 2022-02-05 DIAGNOSIS — K219 Gastro-esophageal reflux disease without esophagitis: Secondary | ICD-10-CM | POA: Diagnosis not present

## 2022-02-05 DIAGNOSIS — Z9981 Dependence on supplemental oxygen: Secondary | ICD-10-CM | POA: Diagnosis not present

## 2022-02-05 DIAGNOSIS — Z792 Long term (current) use of antibiotics: Secondary | ICD-10-CM | POA: Diagnosis not present

## 2022-02-05 DIAGNOSIS — M6281 Muscle weakness (generalized): Secondary | ICD-10-CM | POA: Diagnosis not present

## 2022-02-05 DIAGNOSIS — I10 Essential (primary) hypertension: Secondary | ICD-10-CM | POA: Diagnosis not present

## 2022-02-05 DIAGNOSIS — K5900 Constipation, unspecified: Secondary | ICD-10-CM | POA: Diagnosis not present

## 2022-02-05 DIAGNOSIS — Z9181 History of falling: Secondary | ICD-10-CM | POA: Diagnosis not present

## 2022-02-05 DIAGNOSIS — E785 Hyperlipidemia, unspecified: Secondary | ICD-10-CM | POA: Diagnosis not present

## 2022-02-05 DIAGNOSIS — J441 Chronic obstructive pulmonary disease with (acute) exacerbation: Secondary | ICD-10-CM | POA: Diagnosis not present

## 2022-02-07 DIAGNOSIS — M25552 Pain in left hip: Secondary | ICD-10-CM | POA: Diagnosis not present

## 2022-02-07 DIAGNOSIS — I10 Essential (primary) hypertension: Secondary | ICD-10-CM | POA: Diagnosis not present

## 2022-02-07 DIAGNOSIS — I129 Hypertensive chronic kidney disease with stage 1 through stage 4 chronic kidney disease, or unspecified chronic kidney disease: Secondary | ICD-10-CM | POA: Diagnosis not present

## 2022-02-07 DIAGNOSIS — N182 Chronic kidney disease, stage 2 (mild): Secondary | ICD-10-CM | POA: Diagnosis not present

## 2022-02-07 DIAGNOSIS — Z9981 Dependence on supplemental oxygen: Secondary | ICD-10-CM | POA: Diagnosis not present

## 2022-02-07 DIAGNOSIS — R269 Unspecified abnormalities of gait and mobility: Secondary | ICD-10-CM | POA: Diagnosis not present

## 2022-02-07 DIAGNOSIS — Z993 Dependence on wheelchair: Secondary | ICD-10-CM | POA: Diagnosis not present

## 2022-02-07 DIAGNOSIS — Z9181 History of falling: Secondary | ICD-10-CM | POA: Diagnosis not present

## 2022-02-07 DIAGNOSIS — K5900 Constipation, unspecified: Secondary | ICD-10-CM | POA: Diagnosis not present

## 2022-02-07 DIAGNOSIS — Z792 Long term (current) use of antibiotics: Secondary | ICD-10-CM | POA: Diagnosis not present

## 2022-02-07 DIAGNOSIS — K219 Gastro-esophageal reflux disease without esophagitis: Secondary | ICD-10-CM | POA: Diagnosis not present

## 2022-02-07 DIAGNOSIS — E785 Hyperlipidemia, unspecified: Secondary | ICD-10-CM | POA: Diagnosis not present

## 2022-02-07 DIAGNOSIS — J441 Chronic obstructive pulmonary disease with (acute) exacerbation: Secondary | ICD-10-CM | POA: Diagnosis not present

## 2022-02-07 DIAGNOSIS — M6281 Muscle weakness (generalized): Secondary | ICD-10-CM | POA: Diagnosis not present

## 2022-02-07 DIAGNOSIS — M545 Low back pain, unspecified: Secondary | ICD-10-CM | POA: Diagnosis not present

## 2022-02-07 DIAGNOSIS — D509 Iron deficiency anemia, unspecified: Secondary | ICD-10-CM | POA: Diagnosis not present

## 2022-02-07 DIAGNOSIS — J449 Chronic obstructive pulmonary disease, unspecified: Secondary | ICD-10-CM | POA: Diagnosis not present

## 2022-02-12 DIAGNOSIS — I10 Essential (primary) hypertension: Secondary | ICD-10-CM | POA: Diagnosis not present

## 2022-02-12 DIAGNOSIS — J441 Chronic obstructive pulmonary disease with (acute) exacerbation: Secondary | ICD-10-CM | POA: Diagnosis not present

## 2022-02-12 DIAGNOSIS — Z792 Long term (current) use of antibiotics: Secondary | ICD-10-CM | POA: Diagnosis not present

## 2022-02-12 DIAGNOSIS — Z9981 Dependence on supplemental oxygen: Secondary | ICD-10-CM | POA: Diagnosis not present

## 2022-02-12 DIAGNOSIS — K219 Gastro-esophageal reflux disease without esophagitis: Secondary | ICD-10-CM | POA: Diagnosis not present

## 2022-02-12 DIAGNOSIS — Z9181 History of falling: Secondary | ICD-10-CM | POA: Diagnosis not present

## 2022-02-12 DIAGNOSIS — Z79899 Other long term (current) drug therapy: Secondary | ICD-10-CM | POA: Diagnosis not present

## 2022-02-12 DIAGNOSIS — M6281 Muscle weakness (generalized): Secondary | ICD-10-CM | POA: Diagnosis not present

## 2022-02-12 DIAGNOSIS — K5900 Constipation, unspecified: Secondary | ICD-10-CM | POA: Diagnosis not present

## 2022-02-12 DIAGNOSIS — E785 Hyperlipidemia, unspecified: Secondary | ICD-10-CM | POA: Diagnosis not present

## 2022-02-14 DIAGNOSIS — Z792 Long term (current) use of antibiotics: Secondary | ICD-10-CM | POA: Diagnosis not present

## 2022-02-14 DIAGNOSIS — K5900 Constipation, unspecified: Secondary | ICD-10-CM | POA: Diagnosis not present

## 2022-02-14 DIAGNOSIS — I10 Essential (primary) hypertension: Secondary | ICD-10-CM | POA: Diagnosis not present

## 2022-02-14 DIAGNOSIS — J441 Chronic obstructive pulmonary disease with (acute) exacerbation: Secondary | ICD-10-CM | POA: Diagnosis not present

## 2022-02-14 DIAGNOSIS — Z9181 History of falling: Secondary | ICD-10-CM | POA: Diagnosis not present

## 2022-02-14 DIAGNOSIS — M6281 Muscle weakness (generalized): Secondary | ICD-10-CM | POA: Diagnosis not present

## 2022-02-14 DIAGNOSIS — K219 Gastro-esophageal reflux disease without esophagitis: Secondary | ICD-10-CM | POA: Diagnosis not present

## 2022-02-14 DIAGNOSIS — D509 Iron deficiency anemia, unspecified: Secondary | ICD-10-CM | POA: Diagnosis not present

## 2022-02-14 DIAGNOSIS — Z9981 Dependence on supplemental oxygen: Secondary | ICD-10-CM | POA: Diagnosis not present

## 2022-02-14 DIAGNOSIS — E785 Hyperlipidemia, unspecified: Secondary | ICD-10-CM | POA: Diagnosis not present

## 2022-02-19 DIAGNOSIS — Z792 Long term (current) use of antibiotics: Secondary | ICD-10-CM | POA: Diagnosis not present

## 2022-02-19 DIAGNOSIS — M6281 Muscle weakness (generalized): Secondary | ICD-10-CM | POA: Diagnosis not present

## 2022-02-19 DIAGNOSIS — I10 Essential (primary) hypertension: Secondary | ICD-10-CM | POA: Diagnosis not present

## 2022-02-19 DIAGNOSIS — Z9981 Dependence on supplemental oxygen: Secondary | ICD-10-CM | POA: Diagnosis not present

## 2022-02-19 DIAGNOSIS — J441 Chronic obstructive pulmonary disease with (acute) exacerbation: Secondary | ICD-10-CM | POA: Diagnosis not present

## 2022-02-19 DIAGNOSIS — E785 Hyperlipidemia, unspecified: Secondary | ICD-10-CM | POA: Diagnosis not present

## 2022-02-19 DIAGNOSIS — Z9181 History of falling: Secondary | ICD-10-CM | POA: Diagnosis not present

## 2022-02-19 DIAGNOSIS — K5900 Constipation, unspecified: Secondary | ICD-10-CM | POA: Diagnosis not present

## 2022-02-19 DIAGNOSIS — K219 Gastro-esophageal reflux disease without esophagitis: Secondary | ICD-10-CM | POA: Diagnosis not present

## 2022-02-22 DIAGNOSIS — J441 Chronic obstructive pulmonary disease with (acute) exacerbation: Secondary | ICD-10-CM | POA: Diagnosis not present

## 2022-02-22 DIAGNOSIS — K5900 Constipation, unspecified: Secondary | ICD-10-CM | POA: Diagnosis not present

## 2022-02-22 DIAGNOSIS — Z9981 Dependence on supplemental oxygen: Secondary | ICD-10-CM | POA: Diagnosis not present

## 2022-02-22 DIAGNOSIS — K219 Gastro-esophageal reflux disease without esophagitis: Secondary | ICD-10-CM | POA: Diagnosis not present

## 2022-02-22 DIAGNOSIS — M6281 Muscle weakness (generalized): Secondary | ICD-10-CM | POA: Diagnosis not present

## 2022-02-22 DIAGNOSIS — Z9181 History of falling: Secondary | ICD-10-CM | POA: Diagnosis not present

## 2022-02-22 DIAGNOSIS — E785 Hyperlipidemia, unspecified: Secondary | ICD-10-CM | POA: Diagnosis not present

## 2022-02-22 DIAGNOSIS — Z792 Long term (current) use of antibiotics: Secondary | ICD-10-CM | POA: Diagnosis not present

## 2022-02-22 DIAGNOSIS — I10 Essential (primary) hypertension: Secondary | ICD-10-CM | POA: Diagnosis not present

## 2022-02-23 DIAGNOSIS — S6290XA Unspecified fracture of unspecified wrist and hand, initial encounter for closed fracture: Secondary | ICD-10-CM | POA: Diagnosis not present

## 2022-02-23 DIAGNOSIS — S0990XD Unspecified injury of head, subsequent encounter: Secondary | ICD-10-CM | POA: Diagnosis not present

## 2022-02-23 DIAGNOSIS — Z5189 Encounter for other specified aftercare: Secondary | ICD-10-CM | POA: Diagnosis not present

## 2022-02-23 DIAGNOSIS — J441 Chronic obstructive pulmonary disease with (acute) exacerbation: Secondary | ICD-10-CM | POA: Diagnosis not present

## 2022-03-01 DIAGNOSIS — Z9181 History of falling: Secondary | ICD-10-CM | POA: Diagnosis not present

## 2022-03-01 DIAGNOSIS — Z792 Long term (current) use of antibiotics: Secondary | ICD-10-CM | POA: Diagnosis not present

## 2022-03-01 DIAGNOSIS — Z9981 Dependence on supplemental oxygen: Secondary | ICD-10-CM | POA: Diagnosis not present

## 2022-03-01 DIAGNOSIS — K5900 Constipation, unspecified: Secondary | ICD-10-CM | POA: Diagnosis not present

## 2022-03-01 DIAGNOSIS — E785 Hyperlipidemia, unspecified: Secondary | ICD-10-CM | POA: Diagnosis not present

## 2022-03-01 DIAGNOSIS — I10 Essential (primary) hypertension: Secondary | ICD-10-CM | POA: Diagnosis not present

## 2022-03-01 DIAGNOSIS — J441 Chronic obstructive pulmonary disease with (acute) exacerbation: Secondary | ICD-10-CM | POA: Diagnosis not present

## 2022-03-01 DIAGNOSIS — K219 Gastro-esophageal reflux disease without esophagitis: Secondary | ICD-10-CM | POA: Diagnosis not present

## 2022-03-01 DIAGNOSIS — M6281 Muscle weakness (generalized): Secondary | ICD-10-CM | POA: Diagnosis not present

## 2022-03-02 DIAGNOSIS — Z792 Long term (current) use of antibiotics: Secondary | ICD-10-CM | POA: Diagnosis not present

## 2022-03-02 DIAGNOSIS — J441 Chronic obstructive pulmonary disease with (acute) exacerbation: Secondary | ICD-10-CM | POA: Diagnosis not present

## 2022-03-02 DIAGNOSIS — K5900 Constipation, unspecified: Secondary | ICD-10-CM | POA: Diagnosis not present

## 2022-03-02 DIAGNOSIS — I10 Essential (primary) hypertension: Secondary | ICD-10-CM | POA: Diagnosis not present

## 2022-03-02 DIAGNOSIS — Z9181 History of falling: Secondary | ICD-10-CM | POA: Diagnosis not present

## 2022-03-02 DIAGNOSIS — Z9981 Dependence on supplemental oxygen: Secondary | ICD-10-CM | POA: Diagnosis not present

## 2022-03-02 DIAGNOSIS — K219 Gastro-esophageal reflux disease without esophagitis: Secondary | ICD-10-CM | POA: Diagnosis not present

## 2022-03-02 DIAGNOSIS — M6281 Muscle weakness (generalized): Secondary | ICD-10-CM | POA: Diagnosis not present

## 2022-03-02 DIAGNOSIS — E785 Hyperlipidemia, unspecified: Secondary | ICD-10-CM | POA: Diagnosis not present

## 2022-03-05 DIAGNOSIS — Z9981 Dependence on supplemental oxygen: Secondary | ICD-10-CM | POA: Diagnosis not present

## 2022-03-05 DIAGNOSIS — K219 Gastro-esophageal reflux disease without esophagitis: Secondary | ICD-10-CM | POA: Diagnosis not present

## 2022-03-05 DIAGNOSIS — K5900 Constipation, unspecified: Secondary | ICD-10-CM | POA: Diagnosis not present

## 2022-03-05 DIAGNOSIS — M6281 Muscle weakness (generalized): Secondary | ICD-10-CM | POA: Diagnosis not present

## 2022-03-05 DIAGNOSIS — J441 Chronic obstructive pulmonary disease with (acute) exacerbation: Secondary | ICD-10-CM | POA: Diagnosis not present

## 2022-03-05 DIAGNOSIS — Z9181 History of falling: Secondary | ICD-10-CM | POA: Diagnosis not present

## 2022-03-05 DIAGNOSIS — E785 Hyperlipidemia, unspecified: Secondary | ICD-10-CM | POA: Diagnosis not present

## 2022-03-05 DIAGNOSIS — I10 Essential (primary) hypertension: Secondary | ICD-10-CM | POA: Diagnosis not present

## 2022-03-05 DIAGNOSIS — Z792 Long term (current) use of antibiotics: Secondary | ICD-10-CM | POA: Diagnosis not present

## 2022-03-07 DIAGNOSIS — I129 Hypertensive chronic kidney disease with stage 1 through stage 4 chronic kidney disease, or unspecified chronic kidney disease: Secondary | ICD-10-CM | POA: Diagnosis not present

## 2022-03-07 DIAGNOSIS — E782 Mixed hyperlipidemia: Secondary | ICD-10-CM | POA: Diagnosis not present

## 2022-03-07 DIAGNOSIS — J441 Chronic obstructive pulmonary disease with (acute) exacerbation: Secondary | ICD-10-CM | POA: Diagnosis not present

## 2022-03-07 DIAGNOSIS — M6281 Muscle weakness (generalized): Secondary | ICD-10-CM | POA: Diagnosis not present

## 2022-03-07 DIAGNOSIS — D509 Iron deficiency anemia, unspecified: Secondary | ICD-10-CM | POA: Diagnosis not present

## 2022-03-07 DIAGNOSIS — E559 Vitamin D deficiency, unspecified: Secondary | ICD-10-CM | POA: Diagnosis not present

## 2022-03-07 DIAGNOSIS — E441 Mild protein-calorie malnutrition: Secondary | ICD-10-CM | POA: Diagnosis not present

## 2022-03-07 DIAGNOSIS — R269 Unspecified abnormalities of gait and mobility: Secondary | ICD-10-CM | POA: Diagnosis not present

## 2022-03-07 DIAGNOSIS — N182 Chronic kidney disease, stage 2 (mild): Secondary | ICD-10-CM | POA: Diagnosis not present

## 2022-03-07 DIAGNOSIS — M545 Low back pain, unspecified: Secondary | ICD-10-CM | POA: Diagnosis not present

## 2022-03-07 DIAGNOSIS — K5900 Constipation, unspecified: Secondary | ICD-10-CM | POA: Diagnosis not present

## 2022-03-07 DIAGNOSIS — Z792 Long term (current) use of antibiotics: Secondary | ICD-10-CM | POA: Diagnosis not present

## 2022-03-07 DIAGNOSIS — E785 Hyperlipidemia, unspecified: Secondary | ICD-10-CM | POA: Diagnosis not present

## 2022-03-07 DIAGNOSIS — Z9981 Dependence on supplemental oxygen: Secondary | ICD-10-CM | POA: Diagnosis not present

## 2022-03-07 DIAGNOSIS — I10 Essential (primary) hypertension: Secondary | ICD-10-CM | POA: Diagnosis not present

## 2022-03-07 DIAGNOSIS — K219 Gastro-esophageal reflux disease without esophagitis: Secondary | ICD-10-CM | POA: Diagnosis not present

## 2022-03-07 DIAGNOSIS — I739 Peripheral vascular disease, unspecified: Secondary | ICD-10-CM | POA: Diagnosis not present

## 2022-03-07 DIAGNOSIS — Z9181 History of falling: Secondary | ICD-10-CM | POA: Diagnosis not present

## 2022-03-09 DIAGNOSIS — E441 Mild protein-calorie malnutrition: Secondary | ICD-10-CM | POA: Diagnosis not present

## 2022-03-09 DIAGNOSIS — I739 Peripheral vascular disease, unspecified: Secondary | ICD-10-CM | POA: Diagnosis not present

## 2022-03-12 DIAGNOSIS — E785 Hyperlipidemia, unspecified: Secondary | ICD-10-CM | POA: Diagnosis not present

## 2022-03-12 DIAGNOSIS — Z792 Long term (current) use of antibiotics: Secondary | ICD-10-CM | POA: Diagnosis not present

## 2022-03-12 DIAGNOSIS — Z9181 History of falling: Secondary | ICD-10-CM | POA: Diagnosis not present

## 2022-03-12 DIAGNOSIS — Z9981 Dependence on supplemental oxygen: Secondary | ICD-10-CM | POA: Diagnosis not present

## 2022-03-12 DIAGNOSIS — I10 Essential (primary) hypertension: Secondary | ICD-10-CM | POA: Diagnosis not present

## 2022-03-12 DIAGNOSIS — J441 Chronic obstructive pulmonary disease with (acute) exacerbation: Secondary | ICD-10-CM | POA: Diagnosis not present

## 2022-03-12 DIAGNOSIS — K5900 Constipation, unspecified: Secondary | ICD-10-CM | POA: Diagnosis not present

## 2022-03-12 DIAGNOSIS — M6281 Muscle weakness (generalized): Secondary | ICD-10-CM | POA: Diagnosis not present

## 2022-03-12 DIAGNOSIS — K219 Gastro-esophageal reflux disease without esophagitis: Secondary | ICD-10-CM | POA: Diagnosis not present

## 2022-03-15 ENCOUNTER — Inpatient Hospital Stay
Admission: EM | Admit: 2022-03-15 | Discharge: 2022-03-24 | DRG: 951 | Disposition: E | Payer: Medicare Other | Source: Skilled Nursing Facility | Attending: Internal Medicine | Admitting: Internal Medicine

## 2022-03-15 ENCOUNTER — Emergency Department: Payer: Medicare Other

## 2022-03-15 ENCOUNTER — Other Ambulatory Visit: Payer: Self-pay

## 2022-03-15 DIAGNOSIS — F0390 Unspecified dementia without behavioral disturbance: Secondary | ICD-10-CM | POA: Diagnosis present

## 2022-03-15 DIAGNOSIS — R6889 Other general symptoms and signs: Secondary | ICD-10-CM | POA: Diagnosis not present

## 2022-03-15 DIAGNOSIS — Z833 Family history of diabetes mellitus: Secondary | ICD-10-CM

## 2022-03-15 DIAGNOSIS — J9621 Acute and chronic respiratory failure with hypoxia: Secondary | ICD-10-CM | POA: Diagnosis present

## 2022-03-15 DIAGNOSIS — E8770 Fluid overload, unspecified: Secondary | ICD-10-CM | POA: Diagnosis present

## 2022-03-15 DIAGNOSIS — A419 Sepsis, unspecified organism: Secondary | ICD-10-CM | POA: Diagnosis not present

## 2022-03-15 DIAGNOSIS — J441 Chronic obstructive pulmonary disease with (acute) exacerbation: Secondary | ICD-10-CM | POA: Diagnosis not present

## 2022-03-15 DIAGNOSIS — J9620 Acute and chronic respiratory failure, unspecified whether with hypoxia or hypercapnia: Secondary | ICD-10-CM | POA: Diagnosis not present

## 2022-03-15 DIAGNOSIS — Z8249 Family history of ischemic heart disease and other diseases of the circulatory system: Secondary | ICD-10-CM | POA: Diagnosis not present

## 2022-03-15 DIAGNOSIS — J9601 Acute respiratory failure with hypoxia: Secondary | ICD-10-CM

## 2022-03-15 DIAGNOSIS — F0392 Unspecified dementia, unspecified severity, with psychotic disturbance: Secondary | ICD-10-CM | POA: Diagnosis present

## 2022-03-15 DIAGNOSIS — R0902 Hypoxemia: Secondary | ICD-10-CM | POA: Diagnosis not present

## 2022-03-15 DIAGNOSIS — Z743 Need for continuous supervision: Secondary | ICD-10-CM | POA: Diagnosis not present

## 2022-03-15 DIAGNOSIS — R0689 Other abnormalities of breathing: Secondary | ICD-10-CM | POA: Diagnosis not present

## 2022-03-15 DIAGNOSIS — Z9981 Dependence on supplemental oxygen: Secondary | ICD-10-CM

## 2022-03-15 DIAGNOSIS — H3530 Unspecified macular degeneration: Secondary | ICD-10-CM | POA: Diagnosis present

## 2022-03-15 DIAGNOSIS — R739 Hyperglycemia, unspecified: Secondary | ICD-10-CM | POA: Diagnosis present

## 2022-03-15 DIAGNOSIS — Z87891 Personal history of nicotine dependence: Secondary | ICD-10-CM

## 2022-03-15 DIAGNOSIS — I2721 Secondary pulmonary arterial hypertension: Secondary | ICD-10-CM | POA: Diagnosis not present

## 2022-03-15 DIAGNOSIS — F05 Delirium due to known physiological condition: Secondary | ICD-10-CM | POA: Diagnosis present

## 2022-03-15 DIAGNOSIS — E663 Overweight: Secondary | ICD-10-CM | POA: Diagnosis present

## 2022-03-15 DIAGNOSIS — Z79899 Other long term (current) drug therapy: Secondary | ICD-10-CM

## 2022-03-15 DIAGNOSIS — Z66 Do not resuscitate: Secondary | ICD-10-CM | POA: Diagnosis present

## 2022-03-15 DIAGNOSIS — J449 Chronic obstructive pulmonary disease, unspecified: Secondary | ICD-10-CM | POA: Diagnosis present

## 2022-03-15 DIAGNOSIS — R0602 Shortness of breath: Secondary | ICD-10-CM | POA: Diagnosis not present

## 2022-03-15 DIAGNOSIS — I248 Other forms of acute ischemic heart disease: Secondary | ICD-10-CM | POA: Diagnosis not present

## 2022-03-15 DIAGNOSIS — G9341 Metabolic encephalopathy: Secondary | ICD-10-CM | POA: Diagnosis present

## 2022-03-15 DIAGNOSIS — Z8673 Personal history of transient ischemic attack (TIA), and cerebral infarction without residual deficits: Secondary | ICD-10-CM

## 2022-03-15 DIAGNOSIS — I10 Essential (primary) hypertension: Secondary | ICD-10-CM | POA: Diagnosis present

## 2022-03-15 DIAGNOSIS — R6521 Severe sepsis with septic shock: Secondary | ICD-10-CM | POA: Diagnosis not present

## 2022-03-15 DIAGNOSIS — M21372 Foot drop, left foot: Secondary | ICD-10-CM | POA: Diagnosis not present

## 2022-03-15 DIAGNOSIS — Z6829 Body mass index (BMI) 29.0-29.9, adult: Secondary | ICD-10-CM

## 2022-03-15 DIAGNOSIS — Z20822 Contact with and (suspected) exposure to covid-19: Secondary | ICD-10-CM | POA: Diagnosis not present

## 2022-03-15 DIAGNOSIS — J189 Pneumonia, unspecified organism: Secondary | ICD-10-CM | POA: Diagnosis present

## 2022-03-15 DIAGNOSIS — I499 Cardiac arrhythmia, unspecified: Secondary | ICD-10-CM | POA: Diagnosis not present

## 2022-03-15 DIAGNOSIS — Z515 Encounter for palliative care: Principal | ICD-10-CM

## 2022-03-15 DIAGNOSIS — J9622 Acute and chronic respiratory failure with hypercapnia: Secondary | ICD-10-CM | POA: Diagnosis present

## 2022-03-15 DIAGNOSIS — J439 Emphysema, unspecified: Secondary | ICD-10-CM | POA: Diagnosis not present

## 2022-03-15 DIAGNOSIS — J90 Pleural effusion, not elsewhere classified: Secondary | ICD-10-CM | POA: Diagnosis not present

## 2022-03-15 DIAGNOSIS — Y95 Nosocomial condition: Secondary | ICD-10-CM | POA: Diagnosis present

## 2022-03-15 DIAGNOSIS — Z791 Long term (current) use of non-steroidal anti-inflammatories (NSAID): Secondary | ICD-10-CM

## 2022-03-15 DIAGNOSIS — R069 Unspecified abnormalities of breathing: Secondary | ICD-10-CM | POA: Diagnosis not present

## 2022-03-15 DIAGNOSIS — J9690 Respiratory failure, unspecified, unspecified whether with hypoxia or hypercapnia: Secondary | ICD-10-CM | POA: Diagnosis present

## 2022-03-15 DIAGNOSIS — Z96642 Presence of left artificial hip joint: Secondary | ICD-10-CM | POA: Diagnosis not present

## 2022-03-15 LAB — COMPREHENSIVE METABOLIC PANEL
ALT: 13 U/L (ref 0–44)
AST: 19 U/L (ref 15–41)
Albumin: 3.7 g/dL (ref 3.5–5.0)
Alkaline Phosphatase: 76 U/L (ref 38–126)
Anion gap: 8 (ref 5–15)
BUN: 21 mg/dL (ref 8–23)
CO2: 29 mmol/L (ref 22–32)
Calcium: 9 mg/dL (ref 8.9–10.3)
Chloride: 106 mmol/L (ref 98–111)
Creatinine, Ser: 0.73 mg/dL (ref 0.44–1.00)
GFR, Estimated: >60 mL/min (ref >60)
Glucose, Bld: 209 mg/dL — ABNORMAL HIGH (ref 70–99)
Potassium: 3.7 mmol/L (ref 3.5–5.1)
Sodium: 143 mmol/L (ref 135–145)
Total Bilirubin: 0.5 mg/dL (ref 0.3–1.2)
Total Protein: 7 g/dL (ref 6.5–8.1)

## 2022-03-15 LAB — CBC WITH DIFFERENTIAL/PLATELET
Abs Immature Granulocytes: 0.05 10*3/uL (ref 0.00–0.07)
Basophils Absolute: 0.1 10*3/uL (ref 0.0–0.1)
Basophils Relative: 1 %
Eosinophils Absolute: 0.3 10*3/uL (ref 0.0–0.5)
Eosinophils Relative: 3 %
HCT: 42.7 % (ref 36.0–46.0)
Hemoglobin: 13.1 g/dL (ref 12.0–15.0)
Immature Granulocytes: 1 %
Lymphocytes Relative: 47 %
Lymphs Abs: 4.5 10*3/uL — ABNORMAL HIGH (ref 0.7–4.0)
MCH: 28.1 pg (ref 26.0–34.0)
MCHC: 30.7 g/dL (ref 30.0–36.0)
MCV: 91.6 fL (ref 80.0–100.0)
Monocytes Absolute: 0.7 10*3/uL (ref 0.1–1.0)
Monocytes Relative: 7 %
Neutro Abs: 3.8 10*3/uL (ref 1.7–7.7)
Neutrophils Relative %: 41 %
Platelets: 271 10*3/uL (ref 150–400)
RBC: 4.66 MIL/uL (ref 3.87–5.11)
RDW: 14.9 % (ref 11.5–15.5)
WBC: 9.5 10*3/uL (ref 4.0–10.5)
nRBC: 0 % (ref 0.0–0.2)

## 2022-03-15 LAB — RESP PANEL BY RT-PCR (FLU A&B, COVID) ARPGX2
Influenza A by PCR: NEGATIVE
Influenza B by PCR: NEGATIVE
SARS Coronavirus 2 by RT PCR: NEGATIVE

## 2022-03-15 LAB — BLOOD GAS, VENOUS
Acid-Base Excess: 5.3 mmol/L — ABNORMAL HIGH (ref 0.0–2.0)
Bicarbonate: 34.9 mmol/L — ABNORMAL HIGH (ref 20.0–28.0)
O2 Saturation: 58 %
Patient temperature: 37
pCO2, Ven: 76 mmHg (ref 44–60)
pH, Ven: 7.27 (ref 7.25–7.43)
pO2, Ven: 40 mmHg (ref 32–45)

## 2022-03-15 LAB — URINALYSIS, ROUTINE W REFLEX MICROSCOPIC
Bilirubin Urine: NEGATIVE
Glucose, UA: NEGATIVE mg/dL
Hgb urine dipstick: NEGATIVE
Ketones, ur: NEGATIVE mg/dL
Leukocytes,Ua: NEGATIVE
Nitrite: NEGATIVE
Protein, ur: NEGATIVE mg/dL
Specific Gravity, Urine: >1.046 — ABNORMAL HIGH (ref 1.005–1.030)
pH: 6 (ref 5.0–8.0)

## 2022-03-15 LAB — LACTIC ACID, PLASMA
Lactic Acid, Venous: 2.4 mmol/L (ref 0.5–1.9)
Lactic Acid, Venous: 2.4 mmol/L (ref 0.5–1.9)

## 2022-03-15 LAB — BRAIN NATRIURETIC PEPTIDE: B Natriuretic Peptide: 168.9 pg/mL — ABNORMAL HIGH (ref 0.0–100.0)

## 2022-03-15 LAB — STREP PNEUMONIAE URINARY ANTIGEN: Strep Pneumo Urinary Antigen: NEGATIVE

## 2022-03-15 LAB — TROPONIN I (HIGH SENSITIVITY)
Troponin I (High Sensitivity): 11 ng/L (ref <18)
Troponin I (High Sensitivity): 42 ng/L — ABNORMAL HIGH (ref <18)

## 2022-03-15 LAB — PROCALCITONIN: Procalcitonin: <0.1 ng/mL

## 2022-03-15 MED ORDER — GLYCOPYRROLATE 0.2 MG/ML IJ SOLN
0.2000 mg | INTRAMUSCULAR | Status: DC | PRN
  Filled 2022-03-15: qty 1

## 2022-03-15 MED ORDER — MORPHINE SULFATE (PF) 2 MG/ML IV SOLN
2.0000 mg | Freq: Once | INTRAVENOUS | Status: AC
  Administered 2022-03-15: 2 mg via INTRAVENOUS
  Filled 2022-03-15: qty 1

## 2022-03-15 MED ORDER — SODIUM CHLORIDE 0.9 % IV BOLUS
1000.0000 mL | Freq: Once | INTRAVENOUS | Status: AC
  Administered 2022-03-15: 1000 mL via INTRAVENOUS

## 2022-03-15 MED ORDER — IOHEXOL 350 MG/ML SOLN
75.0000 mL | Freq: Once | INTRAVENOUS | Status: AC | PRN
  Administered 2022-03-15: 75 mL via INTRAVENOUS

## 2022-03-15 MED ORDER — DEXTROSE 5 % IV SOLN: INTRAVENOUS | Status: DC

## 2022-03-15 MED ORDER — FUROSEMIDE 10 MG/ML IJ SOLN: 80.0000 mg | Freq: Once | INTRAMUSCULAR | Status: AC

## 2022-03-15 MED ORDER — ALBUTEROL SULFATE (2.5 MG/3ML) 0.083% IN NEBU: 2.5000 mg | INHALATION_SOLUTION | RESPIRATORY_TRACT | Status: DC | PRN

## 2022-03-15 MED ORDER — GLYCOPYRROLATE 1 MG PO TABS
1.0000 mg | ORAL_TABLET | ORAL | Status: DC | PRN
  Filled 2022-03-15: qty 1

## 2022-03-15 MED ORDER — DIPHENHYDRAMINE HCL 50 MG/ML IJ SOLN: 25.0000 mg | INTRAMUSCULAR | Status: DC | PRN

## 2022-03-15 MED ORDER — ACETAMINOPHEN 325 MG PO TABS: 650.0000 mg | ORAL_TABLET | Freq: Four times a day (QID) | ORAL | Status: DC | PRN

## 2022-03-15 MED ORDER — FUROSEMIDE 10 MG/ML IJ SOLN
INTRAMUSCULAR | Status: AC
  Administered 2022-03-15: 80 mg via INTRAVENOUS
  Filled 2022-03-15: qty 8

## 2022-03-15 MED ORDER — MORPHINE BOLUS VIA INFUSION
5.0000 mg | INTRAVENOUS | Status: DC | PRN
  Filled 2022-03-15: qty 5

## 2022-03-15 MED ORDER — METHYLPREDNISOLONE SODIUM SUCC 40 MG IJ SOLR: 20.0000 mg | Freq: Every day | INTRAMUSCULAR | Status: DC

## 2022-03-15 MED ORDER — INSULIN ASPART 100 UNIT/ML IJ SOLN: 0.0000 [IU] | INTRAMUSCULAR | Status: DC

## 2022-03-15 MED ORDER — AZITHROMYCIN 500 MG IV SOLR
500.0000 mg | Freq: Once | INTRAVENOUS | Status: AC
  Administered 2022-03-15: 500 mg via INTRAVENOUS
  Filled 2022-03-15: qty 5

## 2022-03-15 MED ORDER — VANCOMYCIN HCL 1500 MG/300ML IV SOLN
1500.0000 mg | Freq: Once | INTRAVENOUS | Status: DC
  Filled 2022-03-15: qty 300

## 2022-03-15 MED ORDER — BUDESONIDE 0.25 MG/2ML IN SUSP: 0.2500 mg | Freq: Two times a day (BID) | RESPIRATORY_TRACT | Status: DC

## 2022-03-15 MED ORDER — MORPHINE 100MG IN NS 100ML (1MG/ML) PREMIX INFUSION
0.0000 mg/h | INTRAVENOUS | Status: DC
  Administered 2022-03-15: 5 mg/h via INTRAVENOUS
  Administered 2022-03-15: 10 mg/h via INTRAVENOUS
  Administered 2022-03-16: 12 mg/h via INTRAVENOUS
  Administered 2022-03-16: 10 mg/h via INTRAVENOUS
  Administered 2022-03-17: 12 mg/h via INTRAVENOUS
  Filled 2022-03-15 (×5): qty 100

## 2022-03-15 MED ORDER — SODIUM CHLORIDE 0.9 % IV SOLN: 2.0000 g | Freq: Two times a day (BID) | INTRAVENOUS | Status: DC

## 2022-03-15 MED ORDER — ALBUTEROL SULFATE (2.5 MG/3ML) 0.083% IN NEBU
2.5000 mg | INHALATION_SOLUTION | Freq: Four times a day (QID) | RESPIRATORY_TRACT | Status: DC
  Administered 2022-03-15: 2.5 mg via RESPIRATORY_TRACT
  Filled 2022-03-15: qty 3

## 2022-03-15 MED ORDER — METHYLPREDNISOLONE SODIUM SUCC 125 MG IJ SOLR
125.0000 mg | Freq: Once | INTRAMUSCULAR | Status: AC
  Administered 2022-03-15: 125 mg via INTRAVENOUS
  Filled 2022-03-15: qty 2

## 2022-03-15 MED ORDER — MORPHINE SULFATE (PF) 2 MG/ML IV SOLN: 2.0000 mg | INTRAVENOUS | Status: DC | PRN

## 2022-03-15 MED ORDER — MAGNESIUM SULFATE 2 GM/50ML IV SOLN
2.0000 g | Freq: Once | INTRAVENOUS | Status: AC
  Administered 2022-03-15: 2 g via INTRAVENOUS
  Filled 2022-03-15: qty 50

## 2022-03-15 MED ORDER — LORAZEPAM 2 MG/ML IJ SOLN: 2.0000 mg | INTRAMUSCULAR | Status: DC | PRN

## 2022-03-15 MED ORDER — POLYVINYL ALCOHOL 1.4 % OP SOLN
1.0000 [drp] | Freq: Four times a day (QID) | OPHTHALMIC | Status: DC | PRN
  Filled 2022-03-15: qty 15

## 2022-03-15 MED ORDER — VANCOMYCIN HCL 1250 MG/250ML IV SOLN: 1250.0000 mg | INTRAVENOUS | Status: DC

## 2022-03-15 MED ORDER — CEFTRIAXONE SODIUM 1 G IJ SOLR
1.0000 g | Freq: Once | INTRAMUSCULAR | Status: AC
Start: 2022-03-15 — End: 2022-03-15
  Administered 2022-03-15: 1 g via INTRAVENOUS
  Filled 2022-03-15: qty 10

## 2022-03-15 MED ORDER — IPRATROPIUM-ALBUTEROL 0.5-2.5 (3) MG/3ML IN SOLN
6.0000 mL | Freq: Once | RESPIRATORY_TRACT | Status: AC
  Administered 2022-03-15: 6 mL via RESPIRATORY_TRACT
  Filled 2022-03-15: qty 3

## 2022-03-15 MED ORDER — ACETAMINOPHEN 650 MG RE SUPP: 650.0000 mg | Freq: Four times a day (QID) | RECTAL | Status: DC | PRN

## 2022-03-15 NOTE — ED Notes (Signed)
Pt sats decreasing to 91-92% on 6L [place], pt placed on nonrebreather at 10 L. Sats increased to 95% on nonrebreather. Pts bp 83/57, EDP notified.  ?

## 2022-03-15 NOTE — H&P (Signed)
? ? ?PROGRESS NOTE ? ?Patient: Heather [name] 81 y.o. female ?MRN: [id number] ? ?Today @TODAY @ is hospital day 0 after admission on 03/09/2022  8:37 AM ? ?RECORD REVIEW AND HOSPITAL COURSE: ?Heather [name] is a 81 y.o. female with a stated past medical history of COPD on 2-1/2 L at baseline presents via EMS from her long-term care facility with complaints of shortness of breath.  Patient was noted to be in respiratory distress upon their arrival with initial oxygen saturation of 86% on her 2-1/2 L by nasal cannula.  Patient was placed on 15 L nonrebreather with improvement of her oxygenation to 97.  Due to diminished breath sounds and absence of significant wheezing on auscultation, patient was is not given any medications in route.  Patient was also noted to be tachycardic with heart rate in the 130s upon transport.  Patient's work of breathing significantly increased from baseline per EMS and nursing home staff. ? ?Procedures and Significant Results:  ?CT Angio chest: "There is no evidence of pulmonary artery embolism. There is ectasia of main pulmonary artery suggesting possible pulmonary arterial hypertension.  Infiltrates are seen in both lungs, more so in both lower lobes suggesting atelectasis/pneumonia. There are ground-glass densities in the mid and lower lung fields suggesting possible interstitial pneumonia or interstitial edema. COPD. Small bilateral pleural effusions." ? ?Consultants:  ?ICU ? ? ? ?SUBJECTIVE:  ?Limited - patient only able to state "I cannot breathe." Please see progress note by myself from earlier today. Please see Plan of care note by Dr 97.  ? ? ? ? ?ASSESSMENT & PLAN ? 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 ? ?VTE Ppx: none ?CODE STATUS   Code Status: DNR ?Admitted from: home ?Expected Dispo: expect patient to expire in hospital ? ? ? ? ? ? ? ? ? ? ? ?Past Medical History:  ?Diagnosis Date  ? COPD (chronic obstructive pulmonary disease) (HCC)   ? Hypertension   ? Macular degeneration   ? ? ?Past Surgical History:  ?Procedure Laterality Date  ? JOINT REPLACEMENT    ? Lt hip replacement  ? ? ?Family History  ?Problem Relation Age of Onset  ? Diabetes Mother   ? Hypertension Father   ? ?Social History:  reports that she has quit smoking. She has never used smokeless tobacco. She reports that she does not currently use alcohol. She reports that she does not currently use drugs. ? ?Allergies: No Known Allergies ? ?(Not in a hospital admission) ? ? ?Results for orders placed or performed during the hospital encounter of 03/19/2022 (from the past 48 hour(s))  ?Resp Panel by RT-PCR (Flu A&B, Covid) Nasopharyngeal Swab     Status: None  ? Collection Time: 03/14/2022  8:45 AM  ? Specimen: Nasopharyngeal Swab; Nasopharyngeal(NP) swabs in vial transport medium  ?Result Value Ref Range  ? SARS Coronavirus 2 by RT PCR NEGATIVE NEGATIVE  ?  Comment: (NOTE) ?SARS-CoV-2 target nucleic acids are NOT DETECTED. ? ?The SARS-CoV-2 RNA is generally detectable in upper respiratory ?specimens during the acute phase of infection. The lowest ?concentration of SARS-CoV-2 viral copies this assay can detect is ?138 copies/mL. A negative result does not preclude SARS-Cov-2 ?infection and should not be used as the sole  basis for treatment or ?other patient management decisions. A negative result may occur with  ?improper specimen collection/handling, submission of specimen other ?than nasopharyngeal swab, presence of viral mutation(s) within the ?areas targeted by this assay, and inadequate number of viral ?copies(<138 copies/mL). A negative result must be combined with ?clinical observations, patient history, and  epidemiological ?information. The expected result is Negative. ? ?Fact Sheet for Patients:  ?BloggerCourse.com ? ?Fact Sheet for Healthcare Providers:  ?SeriousBroker.it ? ?This test is no t yet approved or cleared by the [place] FDA and  ?has been authorized for detection and/or diagnosis of SARS-CoV-2 by ?FDA under an Emergency Use Authorization (EUA). This EUA will remain  ?in effect (meaning this test can be used) for the duration of the ?COVID-19 declaration under Section 564(b)(1) of the Act, 21 ?U.S.C.section 360bbb-3(b)(1), unless the authorization is terminated  ?or revoked sooner.  ? ? ?  ? Influenza A by PCR NEGATIVE NEGATIVE  ? Influenza B by PCR NEGATIVE NEGATIVE  ?  Comment: (NOTE) ?The Xpert Xpress SARS-CoV-2/FLU/RSV plus assay is intended as an aid ?in the diagnosis of influenza from Nasopharyngeal swab specimens and ?should not be used as a sole basis for treatment. Nasal washings and ?aspirates are unacceptable for Xpert Xpress SARS-CoV-2/FLU/RSV ?testing. ? ?Fact Sheet for Patients: ?BloggerCourse.com ? ?Fact Sheet for Healthcare Providers: ?SeriousBroker.it ? ?This test is not yet approved or cleared by the [place] FDA and ?has been authorized for detection and/or diagnosis of SARS-CoV-2 by ?FDA under an Emergency Use Authorization (EUA). This EUA will remain ?in effect (meaning this test can be used) for the duration of the ?COVID-19 declaration under Section 564(b)(1) of the Act, 21 U.S.C. ?section 360bbb-3(b)(1), unless the authorization is terminated or ?revoked. ? ?Performed at [facility], [address] [place], ?[place] [address] ?  ?CBC with Differential/Platelet     Status: Abnormal  ? Collection Time: Mar 26, 2022  8:45 AM  ?Result Value Ref Range  ? WBC 9.5 4.0 - 10.5 K/uL  ? RBC 4.66 3.87 - 5.11 MIL/uL  ? Hemoglobin 13.1 12.0 - 15.0 g/dL  ? HCT 42.7 36.0 - 46.0 %  ?  MCV 91.6 80.0 - 100.0 fL  ? MCH 28.1 26.0 - 34.0 pg  ? MCHC 30.7 30.0 - 36.0 g/dL  ? RDW 14.9 11.5 - 15.5 %  ? Platelets 271 150 - 400 K/uL  ? nRBC 0.0 0.0 - 0.2 %  ? Neutrophils Relative % 41 %  ? Neutro Abs 3.8 1.7 - 7.7 K/uL  ? Lymphocytes Relative 47 %  ? Lymphs Abs 4.5 (H) 0.7 - 4.0 K/uL  ? Monocytes Relative 7 %  ? Monocytes Absolute 0.7 0.1 - 1.0 K/uL  ? Eosinophils Relative 3 %  ? Eosinophils Absolute 0.3 0.0 - 0.5 K/uL  ? Basophils Relative 1 %  ? Basophils Absolute 0.1 0.0 - 0.1 K/uL  ? Immature Granulocytes 1 %  ? Abs Immature Granulocytes 0.05 0.00 - 0.07 K/uL  ?  Comment: Performed at [facility], [address] [place], [place] [address]  ?Comprehensive metabolic panel     Status: Abnormal  ? Collection Time: 03/26/2022  8:45 AM  ?Result Value Ref Range  ? Sodium 143 135 - 145 mmol/L  ? Potassium 3.7 3.5 - 5.1 mmol/L  ? Chloride 106 98 - 111 mmol/L  ? CO2 29 22 - 32 mmol/L  ? Glucose, Bld 209 (H) 70 - 99 mg/dL  ?  Comment: Glucose reference range applies only to  samples taken after fasting for at least 8 hours.  ? BUN 21 8 - 23 mg/dL  ? Creatinine, Ser 0.73 0.44 - 1.00 mg/dL  ? Calcium 9.0 8.9 - 10.3 mg/dL  ? Total Protein 7.0 6.5 - 8.1 g/dL  ? Albumin 3.7 3.5 - 5.0 g/dL  ? AST 19 15 - 41 U/L  ? ALT 13 0 - 44 U/L  ? Alkaline Phosphatase 76 38 - 126 U/L  ? Total Bilirubin 0.5 0.3 - 1.2 mg/dL  ? GFR, Estimated >60 >60 mL/min  ?  Comment: (NOTE) ?Calculated using the CKD-EPI Creatinine Equation (2021) ?  ? Anion gap 8 5 - 15  ?  Comment: Performed at [facility], [address] [place], [place] [address]  ?Brain natriuretic peptide     Status: Abnormal  ? Collection Time: 02/23/2022  8:45 AM  ?Result Value Ref Range  ? B Natriuretic Peptide 168.9 (H) 0.0 - 100.0 pg/mL  ?  Comment: Performed at [facility], [address] [place], [place] [address]  ?Troponin I (High Sensitivity)     Status: None  ? Collection Time: 03/01/2022  8:45 AM  ?Result Value Ref Range  ? Troponin I  (High Sensitivity) 11 <18 ng/L  ?  Comment: (NOTE) ?Elevated high sensitivity troponin I (hsTnI) values and significant  ?changes across serial measurements may suggest ACS but many other  ?chronic and a

## 2022-03-15 NOTE — Progress Notes (Signed)
Pharmacy Antibiotic Note ? ?Heather [name] is a 81 y.o. female admitted on 17-Mar-2022 with pneumonia.  Pharmacy has been consulted for vancomycin and cefepime dosing. ? ?Plan: ?Cefepime 2 g IV every 12 hours ?Vancomycin 1500 x 1 loading dose followed by 1250 every 36 hours ?Est AUC: 463.5 ?Used: Scr 0.8 (rounded up), IBW, Vd 0.72 ?Obtain vanc levels around 4th or 5th dose ?Monitor renal function and adjust as clinically indicated ? ?Height: 5' (152.4 cm) ?Weight: 68 kg (149 lb 14.6 oz) ?IBW/kg (Calculated) : 45.5 ? ?Temp (24hrs), Avg:97.6 ?F (36.4 ?C), Min:97.6 ?F (36.4 ?C), Max:97.6 ?F (36.4 ?C) ? ?Recent Labs  ?Lab March 17, 2022 ?0845 03/02/2022 ?1400  ?WBC 9.5  --   ?CREATININE 0.73  --   ?LATICACIDVEN  --  2.4*  ?  ?Estimated Creatinine Clearance: 46.6 mL/min (by C-G formula based on SCr of 0.73 mg/dL).   ? ?No Known Allergies ? ?Antimicrobials this admission: ?3/23 azithromycin and ceftriaxone x 1 ?3/23 vancomycin >>  ?3/23 cefepime >>  ? ? ?Microbiology results: ?3/23 BCx: sent ? ?Thank you for allowing pharmacy to be a part of this patient?s care. ? ?Robyne Peers Hannahmarie Asberry ?02/21/2022 2:53 PM ? ?

## 2022-03-15 NOTE — ED Notes (Signed)
Updated son Heather [name] via phone ?

## 2022-03-15 NOTE — ED Notes (Signed)
RT not available to go with pt to CT, will call CT when RT available.  ?

## 2022-03-15 NOTE — Progress Notes (Signed)
Received verbal report from ED physician and reviewed chart, per report patient was initially satting in the 80s on room air, was stable for a while on nonrebreather and then was requiring BiPAP.  Patient was doing well on BiPAP but then taken off and was stable for some time on O2 via nasal cannula at 4 L and ICU was consulted, could go to floor since BP improved.  ? ?I went to ED to evaluate patient at bedside and when I went into the room to examine the patient prior to starting admission process, she was in clear respiratory distress, SPO2 in 70s on nasal cannula, heart rate in 120s to 130s, patient was stating I cannot breathe." BiPAP was at bedside, I placed BiPAP mask on the patient. RN came into room, called respiratory therapy. ED physician to bedside.  Concern for potential fluid overload, patient given IV Lasix, still significant tachypnea, ED to reconsult with ICU, hospitalist service will continue to follow and update this note as consult versus H&P pending patient stabilization versus continued instability/deterioration ?

## 2022-03-15 NOTE — Assessment & Plan Note (Addendum)
Secondary to CHF and sepsis from healthcare associated pneumonia.  Patient was then made comfort care and antibiotics were discontinued and she was placed on a morphine drip.  She passed away on 04-11-2023. ?

## 2022-03-15 NOTE — Plan of Care (Signed)
? ?  Interdisciplinary Goals of Care Family Meeting ? ? ?Date carried out: March 26, 2022 ? ?Location of the meeting: Unit ? ?Member's involved: Physician, Bedside Registered Nurse, and Family Member or next of kin ? ?Durable Power of Insurance risk surveyor Husband and daughter in law at bedside ? ? ? ?Code status: Full DNR ? ?Disposition: In-patient comfort care ? ? ? ?GOALS OF CARE DISCUSSION ? ?The Clinical status was relayed to family in detail- ? ?Updated and notified of patients medical condition- ?Patient remains unresponsive and will not open eyes to command.   ?Patient is having a weak cough and struggling to remove secretions.   ?Patient with increased WOB and using accessory muscles to breathe ?Explained to family course of therapy and the modalities  ? ?Patient with Progressive multiorgan failure with a very high probablity of a very minimal chance of meaningful recovery despite all aggressive and optimal medical therapy.  ? ? ?Family understands the situation. ?SHe is suffering and suffocating ?Will start morphine infusion then d/c biPAP ? ?They have consented and agreed to DNR/DNI and would like to proceed with Comfort care measures. ? ?Family are satisfied with Plan of action and management. All questions answered ? ?Additional CC time 35 mins ? ? ?Lucie Leather, M.D.  ?[facility]  ?Medical Director [facility] ?Medical Director [facility] Cardio-Pulmonary Department  ? ? ?

## 2022-03-15 NOTE — ED Notes (Signed)
This RN with pt to CT  

## 2022-03-15 NOTE — ED Triage Notes (Signed)
Pt from the [facility], hx of dementia, pt with SOB this am, on 2 L [place] at the [facility], sats 87%. Placed on non re breather by EMS, sats increased to 97% on nrb.  ?

## 2022-03-15 NOTE — ED Notes (Signed)
ET Co2 monitor not working in room, Press photographer and EDP notified.  ?

## 2022-03-15 NOTE — ED Notes (Signed)
Per MD, keep sats above 95%. Pt 91-92 % on 2.5 L [place] after breathing treatments. Pt increased to 4 L [place] with no change. Pt increased to 6 L [place], pt increased to 9% on 6 L [place].  ?

## 2022-03-15 NOTE — ED Provider Notes (Signed)
? ?[facility] ?Provider Note ? ? Event Date/Time  ? First MD Initiated Contact with Patient 02/26/2022 0840   ?  (approximate) ?History  ?Shortness of Breath ? ?HPI ?Heather [name] is a 81 y.o. female with a stated past medical history of COPD on 2-1/2 L at baseline presents via EMS from her long-term care facility with complaints of shortness of breath.  Patient was noted to be in respiratory distress upon their arrival with initial oxygen saturation of 86% on her 2-1/2 L by nasal cannula.  Patient was placed on 15 L nonrebreather with improvement of her oxygenation to 97.  Due to diminished breath sounds and absence of significant wheezing on auscultation, patient was is not given any medications in route.  Patient was also noted to be tachycardic with heart rate in the 130s upon transport.  Patient's work of breathing significantly increased from baseline per EMS and nursing home staff. ?Physical Exam  ?Triage Vital Signs: ?ED Triage Vitals [03/09/2022 0841]  ?Enc Vitals Group  ?   BP   ?   Pulse   ?   Resp   ?   Temp   ?   Temp src   ?   SpO2 99 %  ?   Weight   ?   Height   ?   Head Circumference   ?   Peak Flow   ?   Pain Score   ?   Pain Loc   ?   Pain Edu?   ?   Excl. in [place]?   ? ?Most recent vital signs: ?Vitals:  ? 03/01/2022 1415 02/27/2022 1445  ?BP: (!) 93/52 97/62  ?Pulse: 77 82  ?Resp: 19 18  ?Temp:    ?SpO2:  100%  ? ?General: Awake, oriented x1 ?CV:  Good peripheral perfusion.  ?Resp:  Increased effort.  Poor air movement with scattered end expiratory wheezes bilaterally ?Abd:  No distention.  ?Other:  Elderly overweight Caucasian female sitting in bed in mild respiratory distress with nonrebreather in place ?ED Results / Procedures / Treatments  ?Labs ?(all labs ordered are listed, but only abnormal results are displayed) ?Labs Reviewed  ?CBC WITH DIFFERENTIAL/PLATELET - Abnormal; Notable for the following components:  ?    Result Value  ? Lymphs Abs 4.5 (*)   ? All other components  within normal limits  ?COMPREHENSIVE METABOLIC PANEL - Abnormal; Notable for the following components:  ? Glucose, Bld 209 (*)   ? All other components within normal limits  ?BRAIN NATRIURETIC PEPTIDE - Abnormal; Notable for the following components:  ? B Natriuretic Peptide 168.9 (*)   ? All other components within normal limits  ?BLOOD GAS, VENOUS - Abnormal; Notable for the following components:  ? pCO2, Ven 76 (*)   ? Bicarbonate 34.9 (*)   ? Acid-Base Excess 5.3 (*)   ? All other components within normal limits  ?URINALYSIS, ROUTINE W REFLEX MICROSCOPIC - Abnormal; Notable for the following components:  ? Color, Urine YELLOW (*)   ? APPearance CLEAR (*)   ? Specific Gravity, Urine >1.046 (*)   ? All other components within normal limits  ?LACTIC ACID, PLASMA - Abnormal; Notable for the following components:  ? Lactic Acid, Venous 2.4 (*)   ? All other components within normal limits  ?TROPONIN I (HIGH SENSITIVITY) - Abnormal; Notable for the following components:  ? Troponin I (High Sensitivity) 42 (*)   ? All other components within normal limits  ?RESP PANEL BY RT-PCR (  FLU A&B, COVID) [id number]  ?CULTURE, BLOOD (ROUTINE X 2)  ?CULTURE, BLOOD (ROUTINE X 2)  ?RESPIRATORY PANEL BY PCR  ?EXPECTORATED SPUTUM ASSESSMENT W GRAM STAIN, RFLX TO RESP C  ?LACTIC ACID, PLASMA  ?PROCALCITONIN  ?STREP PNEUMONIAE URINARY ANTIGEN  ?LEGIONELLA PNEUMOPHILA SEROGP 1 UR AG  ?TROPONIN I (HIGH SENSITIVITY)  ? ?EKG ?ED ECG REPORT ?I, Naaman Plummer, the attending physician, personally viewed and interpreted this ECG. ?Date: 03/11/2022 ?EKG Time: 0845 ?Rate: 124 ?Rhythm: normal sinus rhythm ?QRS Axis: normal ?Intervals: normal ?ST/T Wave abnormalities: normal ?Narrative Interpretation: no evidence of acute ischemia ?RADIOLOGY ?ED MD interpretation: CT angiography of the chest with IV contrast shows no evidence of pulmonary embolism.  There are groundglass densities in the mid and lower lung field suggesting possible interstitial  pneumonia or interstitial edema.  There is COPD appreciated as well as small bilateral pleural effusions ? ?Single view portable chest x-ray as interpreted by me shows mild bilateral interstitial thickening slightly worsened from previous exam suggesting mildly worsened moderate interstitial pulmonary edema versus pneumonia ?-Agree with radiology assessment ?Official radiology report(s): ?CT Angio Chest PE W/Cm &/Or Wo Cm ? ?Result Date: 03/14/2022 ?CLINICAL DATA:  Shortness of breath, hypoxia EXAM: CT ANGIOGRAPHY CHEST WITH CONTRAST TECHNIQUE: Multidetector CT imaging of the chest was performed using the standard protocol during bolus administration of intravenous contrast. Multiplanar CT image reconstructions and MIPs were obtained to evaluate the vascular anatomy. RADIATION DOSE REDUCTION: This exam was performed according to the departmental dose-optimization program which includes automated exposure control, adjustment of the mA and/or kV according to patient size and/or use of iterative reconstruction technique. CONTRAST:  56mL OMNIPAQUE IOHEXOL 350 MG/ML SOLN COMPARISON:  11/30/2021 FINDINGS: Cardiovascular: Contrast density in the thoracic aorta is less than adequate to evaluate the lumen. There is ectasia of ascending thoracic aorta measuring 3.7 cm. There is mild ectasia of main pulmonary artery measuring 3.3 cm. There are no intraluminal filling defects in the central pulmonary artery branches. Evaluation of small peripheral branches in the lower lung fields is limited by infiltrates. Coronary artery calcifications are seen. Mediastinum/Nodes: No new significant lymphadenopathy seen. Lungs/Pleura: Centrilobular emphysema is seen. Breathing motion limits evaluation of lung fields. There is patchy alveolar infiltrate in the posterior segment of right upper lobe. Subtle increased markings are seen in the posterior segment of left upper lobe. There is small linear infiltrate in the central portion of right  upper lung fields. There are faint ground-glass densities in the mid and lower lung fields, more so on the right side. There are patchy infiltrates in both lower lobes. Minimal bilateral pleural effusions are seen. There is no pneumothorax. Upper Abdomen: There is subtle increased density in the lumen of gallbladder. There is 2.2 cm nodule in the left adrenal with no significant change. There is moderate sized fixed hiatal hernia. Musculoskeletal: There is decrease in height of body of T3 vertebra which has not changed. There is exaggeration of kyphosis in the upper thoracic spine. Review of the MIP images confirms the above findings. IMPRESSION: There is no evidence of pulmonary artery embolism. There is ectasia of main pulmonary artery suggesting possible pulmonary arterial hypertension. Infiltrates are seen in both lungs, more so in both lower lobes suggesting atelectasis/pneumonia. There are ground-glass densities in the mid and lower lung fields suggesting possible interstitial pneumonia or interstitial edema. COPD. Small bilateral pleural effusions. There is subtle increased density in the gallbladder lumen suggesting possible gallbladder stones. There is 2.2 cm left adrenal nodule suggesting adenoma or some  other neoplastic process with no significant interval change. There is moderate sized fixed hiatal hernia. Other findings as described in the body of the report. Electronically Signed   By: Elmer Picker M.D.   On: 03/08/2022 11:07  ? ?DG Chest Port 1 View ? ?Result Date: 03/01/2022 ?CLINICAL DATA:  Shortness of breath EXAM: PORTABLE CHEST 1 VIEW COMPARISON:  AP chest 11/30/2021 and 08/13/2021 FINDINGS: Cardiac silhouette and mediastinal contours are within normal limits. Moderate bilateral interstitial thickening is slightly worsened from prior, with minimal right-greater-than-left midlung heterogeneous airspace opacities. Bibasilar linear subsegmental atelectasis/scarring is unchanged. No pleural  effusion or pneumothorax. No acute skeletal abnormality. IMPRESSION: Moderate bilateral interstitial thickening is slightly worsened from prior and again new compared to 08/13/2021, suggesting mildly worsened moder

## 2022-03-15 NOTE — ED Notes (Signed)
Blood cx's x2 sets sent to lab. ?

## 2022-03-15 NOTE — ED Notes (Signed)
RT called per EDP to start bipap ?

## 2022-03-15 NOTE — ED Notes (Signed)
Family at bedside, pt sleeping, Per family, pt appears comfortable to them. Morphine drip left at current rate. Family encouraged to let this RN know if pt appears agitated or uncomfortable. Family agrees.  ?

## 2022-03-15 NOTE — ED Notes (Signed)
RT placed pt on O2 [place] at 4 L, pt sats remain 97-100 % on 4 L [place], RR increasing at times to 38-40, RT called, per RT if sats are ok, leave pt on 4 L [place] at this time. Pt sats remain at 97-100% at this time. Will continue to monitor.  ?

## 2022-03-15 NOTE — Consult Note (Signed)
? ?NAME:  Heather [name], MRN:  [id number], DOB:  05/28/1941, LOS: 0 ?ADMISSION DATE:  03/19/2022, CONSULTATION DATE:  03/16/2022 ?REFERRING MD: Dr. Vicente MalesBradler, CHIEF COMPLAINT: Shortness of breath, hypoxia ? ?Brief Pt Description / Synopsis:  ?81 year old female, DNR/DNI, admitted with severe sepsis and acute on chronic hypoxic and hypercapnic respiratory failure in the setting of suspected HCAP, pulmonary edema, and AECOPD requiring BiPAP. ? 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 ? ?Patient is currently altered and on BiPAP with no family present, therefore history is obtained from chart review.  Per ED and nursing notes the patient complained of shortness of breath.  Upon EMS arrival she was noted to be in respiratory distress and hypoxic with O2 saturations of 86% on her baseline 2.5 L nasal cannula.  She was placed on nonrebreather mask with improvement of oxygen sats to 97%.   ? ?Upon arrival to the ED she was noted to have increased respiratory effort, with noted poor air movement with scattered end expiratory wheezes bilaterally upon auscultation. ? ?ED Course: ?Initial vital signs: Temperature 97.6 orally, respiratory rate 29, pulse 110, blood pressure 107/70, SPO2 97% on nonrebreather mask ?Significant Labs: Glucose 209, BUN 21, creatinine 0.73, BNP 168.9, high-sensitivity troponin 11(trended up to 42), WBC 9.5 with lymphocytosis, lactic acid 2.4 ?VBG: pH 7.27/PCO2 76/PO2 40/bicarbonate 34.9 ?COVID-19 and influenza PCR both negative ?Imaging: ?Chest x-ray>>IMPRESSION: ?Moderate bilateral interstitial thickening is slightly worsened from ?prior and again new compared to 08/13/2021, suggesting mildly ?worsened  moderate interstitial pulmonary edema. ?CTA chest>>IMPRESSION: ?There is no evidence of pulmonary artery embolism. There is ectasia ?of main pulmonary artery suggesting possible pulmonary arterial ?hypertension. ?Infiltrates are seen in both lungs, more so in both lower lobes ?suggesting atelectasis/pneumonia. There are ground-glass densities ?in the mid and lower lung fields suggesting possible interstitial ?pneumonia or interstitial edema. COPD. Small bilateral pleural ?effusions. ?There is subtle increased density in the gallbladder lumen ?suggesting possible gallbladder stones. There is 2.2 cm left adrenal ?nodule suggesting adenoma or some other neoplastic process with no ?significant interval change. There is moderate sized fixed hiatal ?hernia. ?EKG: Normal sinus rhythm, no ST/T wave abnormalities or evidence of acute ischemia ?Medications given: DuoNeb, 2 g magnesium, 125 mg Solu-Medrol, IV azithromycin and ceftriaxone, 2 L normal saline boluses ? ?While in the ED her blood pressure was soft with the lowest reading of 89/51 (MAP 62) which was fluid responsive, blood pressure has improved to 98/64 (MAP 74).  PCCM was consulted in the event that her blood pressure were to drop again and possibly need vasopressors. ? ? ?Pertinent  Medical History  ?COPD requiring 2.5 L supplemental O2 ?Hypertension ?Stroke ?Dementia ?Macular degeneration ?Shingles ? ?Micro Data:  ?03/22/2022: SARS-CoV-2 and influenza PCR>> negative ?03/16/2022: Respiratory viral panel>> ?03/19/2022: Blood culture x2>> ?02/28/2022: Urine>> ?03/21/2022: Strep pneumo urinary antigen>> ?03/11/2022: Legionella urinary antigen>> ?03/19/2022: Sputum>> ? ?Antimicrobials:  ?Azithromycin x1 dose 3/23 ?Ceftriaxone x1 dose 3/23 ?Cefepime 3/23>> ?Vancomycin 3/23>> ? ?Significant Hospital Events: ?Including procedures, antibiotic start and stop dates in addition to other pertinent events   ?3/23: Presented to ED, hospitalist to admit patient.  PCCM consulted in  case patient were to potentially need vasopressors ? ?Interim History / Subjective:  ?-Presented to ED due to complaints of shortness of breath  and hypoxia ?-Placed on BiPAP, lethargic but arouses unable to follow simple commands ?-Blood pressure soft in the ED with lowest reading 89/51, which was fluid responsive ?-Hospitalist to admit ?-PCCM consulted in case patient were to need vasopressors ? ?Objective   ?Blood pressure (!) 93/52, pulse 77, temperature 97.6 ?F (36.4 ?C), temperature source Oral, resp. rate 19, height 5' (1.524 m), weight 68 kg, SpO2 98 %. ?   ?   ? ?Intake/Output Summary (Last 24 hours) at 03/02/2022 1449 ?Last data filed at 02/25/2022 1137 ?Gross per 24 hour  ?Intake 1000 ml  ?Output --  ?Net 1000 ml  ? ?Filed Weights  ? 02/24/2022 0844  ?Weight: 68 kg  ? ? ?Examination: ?General: Acute on chronically ill-appearing frail female, laying in bed, on BiPAP, no acute distress ?HENT: Atraumatic, normocephalic, neck supple, no JVD ?Lungs: Diminished/distant breath sounds throughout, no wheezing or rales noted, BiPAP assisted, even, no accessory muscle use ?Cardiovascular: Tachycardia, regular rhythm, S1-S2, no murmurs, rubs, gallops ?Abdomen: Soft, nontender, nondistended, no guarding rebound tenderness, bowel sounds positive x4 ?Extremities: Generalized weakness, foot drop to the left foot, no cyanosis or clubbing, trace edema bilateral lower extremities ?Neuro: Lethargic, arouses to voice, moves all extremities to command, no focal deficits, pupils PERRLA ?GU: Depends diaper in place, incontinent ? ?Resolved Hospital Problem list   ? ? ?Assessment & Plan:  ? ?Acute on Chronic Hypoxic & Hypercapnic Respiratory Failure in the setting of suspected HCAP, Pulmonary Edema & AECOPD ?PMHx: COPD requiring 2.5L supplemental O2 at baseline ?-Supplemental O2 as needed to maintain O2 sats 88 to 92% ?-BiPAP, wean as tolerated ?-Follow intermittent Chest X-ray & ABG as needed ?-Bronchodilators & Pulmicort nebs ?-IV  Steroids ?-ABX as above ?-Diuresis as BP and renal function permits ~ currently holding due to soft BP ?-Pulmonary toilet as able ? ?Sepsis in the setting of suspected Healthcare Associated Pneumonia ?Meets SIRS Criteria on admission (RR 29, HR 110, Lactic 2.4) ?-Monitor fever curve ?-Trend WBC's & Procalcitonin ?-Follow cultures as above ?-Continue empiric Cefepime & Vancomycin pending cultures & sensitivities ? ?Suspected new CHF ?Hypotension (fluid responsive): suspect due to dehydration +/- Sepsis ?Mildly elevated troponin, suspect demand ischemia ?PMHx: Hypertension ?-Continuous cardiac monitoring ?-Maintain MAP >65 ?-Cautious IV fluids (received 2L NS boluses in ED) ?-Vasopressors as needed to maintain MAP goal ?-Trend lactic acid until normalized (2.4 ~ ) ?-Trend HS Troponin until peaked (11 ~ 42 ~ ) ?-Echocardiogram pending ? ?Hyperglycemia ?-CBG's q4h; Target range of 140 to 180 ?-SSI ?-Follow ICU Hypo/Hyperglycemia protocol ?-Check Hgb A1c ? ?Acute Metabolic Encephalopathy in the setting of Sepsis and CO2 Narcosis ?PMHx: Dementia, Stroke ?-Treat metabolic processes as above ?-Provide supportive care ?-Promote normal sleep/wake cycle ?-Avoid sedating meds as able ?-If no improvement once metabolic processes treated, consider head CT ? ? ? ? ?Best Practice (right click and "Reselect all SmartList Selections" daily)  ? ?Diet/type: NPO ?DVT prophylaxis: SCD ?GI prophylaxis: N/A ?Lines: N/A ?Foley:  N/A ?Code Status:  DNR ?Last date of multidisciplinary goals of care discussion [N/A] ? ?Pt's son Mikael Spray updated by Dr. Belia Heman 03/20/2022 via telephone. ? ?Labs   ?CBC: ?Recent Labs  ?Lab 03/14/2022 ?0845  ?WBC 9.5  ?NEUTROABS 3.8  ?HGB 13.1  ?HCT 42.7  ?MCV 91.6  ?PLT 271  ? ? ?Basic Metabolic Panel: ?Recent Labs  ?Lab 02/22/2022 ?0845  ?NA 143  ?K 3.7  ?CL 106  ?CO2 29  ?GLUCOSE 209*  ?BUN 21  ?CREATININE 0.73  ?CALCIUM 9.0  ? ?GFR: ?Estimated Creatinine  Clearance: 46.6 mL/min (by C-G formula based on SCr of 0.73  mg/dL). ?Recent Labs  ?Lab 03/27/22 ?0845 27-Mar-2022 ?1400  ?WBC 9.5  --   ?LATICACIDVEN  --  2.4*  ? ? ?Liver Function Tests: ?Recent Labs  ?Lab 03-27-2022 ?0845  ?AST 19  ?ALT 13  ?ALKPHOS 76  ?BILITOT 0.

## 2022-03-15 NOTE — ED Notes (Addendum)
At 1615, pt noted with increased RR  and decreased sats by admitting MD, pt placed on bipap by admitting MD Sheppard Coil. When this RN entered room, pt agitated and with increased RR, HR and bp. Sats 84-90% on bipap. RT and EDP called into room. Bipap continued, sats increasing to >93% on bipap. Pt given lasix IV per EDP(see MAR), pt continues on bipap, Pt continues with elevated HR, BP, and RR, sats now 99% on bipap.  ?RR 27, HR 115, BP 158/103. EDP aware.  ?

## 2022-03-15 NOTE — ED Notes (Signed)
Per Dr Larinda Buttery hold vanc for now.  ?

## 2022-03-16 DIAGNOSIS — R6521 Severe sepsis with septic shock: Secondary | ICD-10-CM

## 2022-03-16 DIAGNOSIS — E663 Overweight: Secondary | ICD-10-CM

## 2022-03-16 DIAGNOSIS — J9621 Acute and chronic respiratory failure with hypoxia: Secondary | ICD-10-CM | POA: Diagnosis not present

## 2022-03-16 DIAGNOSIS — A419 Sepsis, unspecified organism: Principal | ICD-10-CM

## 2022-03-16 DIAGNOSIS — J9622 Acute and chronic respiratory failure with hypercapnia: Secondary | ICD-10-CM

## 2022-03-16 DIAGNOSIS — F0392 Unspecified dementia, unspecified severity, with psychotic disturbance: Secondary | ICD-10-CM

## 2022-03-16 NOTE — Assessment & Plan Note (Addendum)
Secondary to hypoxia and confusion and hypercarbia.  Patient was calm during comfort care. ?

## 2022-03-16 NOTE — Assessment & Plan Note (Addendum)
Met criteria with BMI greater than 25 ?

## 2022-03-16 NOTE — Progress Notes (Signed)
Nutrition Brief Note  Chart reviewed. Pt now transitioning to comfort care.  No further nutrition interventions planned at this time.  Please re-consult as needed.   Sallie Staron W, RD, LDN, CDCES Registered Dietitian II Certified Diabetes Care and Education Specialist Please refer to AMION for RD and/or RD on-call/weekend/after hours pager   

## 2022-03-16 NOTE — Hospital Course (Addendum)
Patient is an 81 year old female with past medical history of chronic respiratory failure with hypoxia from COPD on 2.5 L nasal cannula, hypertension, dementia and macular degeneration who presented from her skilled nursing facility on 3/23 with shortness of breath.  Patient noted to be in significant respiratory distress as well as hypotension and in the emergency room, found to be in sepsis secondary to healthcare associated pneumonia as well as CHF.  Patient continued to need increased oxygen demand and not tolerating BiPAP well.  After critical care had discussion with family, she was made comfort care and placed on morphine drip.  Patient expired on 2022/03/20 at 9:10 AM. ?

## 2022-03-16 NOTE — TOC Progression Note (Addendum)
Transition of Care (TOC) - Progression Note  ? ? ?Patient Details  ?Name: Heather [name] ?MRN: [id number] ?Date of Birth: 1941-11-12 ? ?Transition of Care (TOC) CM/SW Contact  ?Caryn Section, RN ?Phone Number: ?03/16/2022, 10:21 AM ? ?Clinical Narrative:   Patient is reported to be on comfort care at this time.  TOC to follow for needs. MD reports that patient is on morphine drip, anticipate hospital death. ? ? ? ?  ?  ? ?Expected Discharge Plan and Services ?  ?  ?  ?  ?  ?                ?  ?  ?  ?  ?  ?  ?  ?  ?  ?  ? ? ?Social Determinants of Health (SDOH) Interventions ?  ? ?Readmission Risk Interventions ?   ? View : No data to display.  ?  ?  ?  ? ? ?

## 2022-03-16 NOTE — Assessment & Plan Note (Addendum)
Antihypertensives initially held due to hypotension from sepsis. ?

## 2022-03-16 NOTE — Progress Notes (Signed)
[facility] Progress Note ? ?Patient: Heather [name]    QAS:[id number]  DOA: 04-14-2022    ?Date of Service: the patient was seen and examined on 03/16/2022 ? ?Brief hospital course: ?Patient is an 81 year old female with past medical history of chronic respiratory failure with hypoxia from COPD on 2.5 L nasal cannula, hypertension, dementia and macular degeneration who presented from her skilled nursing facility on 3/23 with shortness of breath.  Patient noted to be in significant respiratory distress as well as hypotension and in the emergency room, found to be in sepsis secondary to healthcare associated pneumonia as well as CHF.  Patient continued to need increased oxygen demand and not tolerating BiPAP well.  After critical care had discussion with family, she was made comfort care and placed on morphine drip. ? ?Assessment and Plan: ?Assessment and Plan: ?Acute on chronic respiratory failure with hypoxia and hypercapnia (HCC) ?Secondary to CHF and sepsis from healthcare associated pneumonia.  Patient now comfort care.  Antibiotics discontinued.  On morphine drip. ? ?Senile dementia with acute confusional state, without behavioral disturbance ?Secondary to hypoxia and confusion and hypercarbia.  Patient now more calm. ? ?Hypertension ?Antihypertensives initially held due to hypotension from sepsis.  Patient now comfort care. ? ?Overweight (BMI 25.0-29.9) ?Meets criteria with BMI greater than 25 ? ? ? ? ? ? ?Body mass index is 29.28 kg/m?.  ?  ?   ? ?Consultants: ?Critical care ? ?Procedures: ?None ? ?Antimicrobials: ?None ? ?Code Status: Comfort care ? ? ?Subjective: Patient currently obtunded ? ?Objective: ?Vital signs reviewed.  As expected, hypotensive with hypoxia ?Vitals:  ? 2022-04-14 1907 03/16/22 0804  ?BP: (!) 102/51 (!) 67/36  ?Pulse: 91 77  ?Resp: (!) 26 14  ?Temp: 97.7 ?F (36.5 ?C) (!) 96.8 ?F (36 ?C)  ?SpO2: (!) 75% 93%  ? ? ?Intake/Output Summary (Last 24 hours) at 03/16/2022 1245 ?Last data filed  at 03/16/2022 0600 ?Gross per 24 hour  ?Intake 110.44 ml  ?Output --  ?Net 110.44 ml  ? ?Filed Weights  ? 2022/04/14 0844  ?Weight: 68 kg  ? ?Body mass index is 29.28 kg/m?. ? ?Exam: ? ?General: Obtunded ?HEENT: Normocephalic, mucous membranes are dry ?Cardiovascular: Regular rhythm, mild tachycardia ?Respiratory: Scattered rales ?Abdomen: Soft, nondistended, hypoactive bowel sounds ?Musculoskeletal: No clubbing or cyanosis or edema ?Skin: No skin breaks, tears or lesions ?Psychiatry: Currently obtunded ?Neurology: Unable to check as patient is currently obtunded ? ?Data Reviewed: ?There are no new results to review at this time. ? ?Disposition:  ?Status is: Inpatient ?Remains inpatient appropriate because: Actively dying and is comfort care ?  ? ?Patient likely will pass within the next 24 hours ? ?Family Communication: Son and daughter-in-law at the bedside ?DVT Prophylaxis: No DVT prophylaxis patient is comfort care ? ? ? ? ?Author: ?Hollice Espy ,MD ?03/16/2022 12:45 PM ? ?To reach On-call, see care teams to locate the attending and reach out via [email or web address]. ?Between 7PM-7AM, please contact night-coverage ?If you still have difficulty reaching the attending provider, please page the [facility] (Director on Call) for [facility] on amion for assistance. ? ?

## 2022-03-17 DIAGNOSIS — J9621 Acute and chronic respiratory failure with hypoxia: Secondary | ICD-10-CM | POA: Diagnosis not present

## 2022-03-17 DIAGNOSIS — E663 Overweight: Secondary | ICD-10-CM | POA: Diagnosis not present

## 2022-03-17 DIAGNOSIS — A419 Sepsis, unspecified organism: Secondary | ICD-10-CM | POA: Diagnosis not present

## 2022-03-17 DIAGNOSIS — F0392 Unspecified dementia, unspecified severity, with psychotic disturbance: Secondary | ICD-10-CM | POA: Diagnosis not present

## 2022-03-19 LAB — LEGIONELLA PNEUMOPHILA SEROGP 1 UR AG: L. pneumophila Serogp 1 Ur Ag: NEGATIVE

## 2022-03-20 LAB — CULTURE, BLOOD (ROUTINE X 2)
Culture: NO GROWTH
Culture: NO GROWTH

## 2022-03-24 NOTE — Death Summary Note (Signed)
? ?DEATH SUMMARY  ? ?Patient Details  ?Name: Heather [name] ?MRN: [id number] ?DOB: 01/20/1941 ?JGO:[id number], [employer] ?Admission/Discharge Information  ? ?Admit Date:  April 06, 2022  ?Date of Death: Date of Death: 2022-04-08  ?Time of Death: Time of Death: 0910  ?Length of Stay: 2  ? ?Principle Cause of death: Septic shock from healthcare associated pneumonia ? ?Hospital Diagnoses: ?Principal Problem: ?  Septic shock ([place]) ?Active Problems: ?  Acute on chronic respiratory failure with hypoxia and hypercapnia (HCC) ?  COPD (chronic obstructive pulmonary disease) ([place]) ?  Senile dementia with acute confusional state, without behavioral disturbance ?  Hypertension ?  Overweight (BMI 25.0-29.9) ? ? ?Hospital Course: ?Patient is an 81 year old female with past medical history of chronic respiratory failure with hypoxia from COPD on 2.5 L nasal cannula, hypertension, dementia and macular degeneration who presented from her skilled nursing facility on 04-07-23 with shortness of breath.  Patient noted to be in significant respiratory distress as well as hypotension and in the emergency room, found to be in sepsis secondary to healthcare associated pneumonia as well as CHF.  Patient continued to need increased oxygen demand and not tolerating BiPAP well.  After critical care had discussion with family, she was made comfort care and placed on morphine drip.  Patient expired on 2022-04-08 at 9:10 AM. ? ?Assessment and Plan: ?* Septic shock ([place]) ?Met criteria for sepsis on admission given persistent hypotension despite IV fluids as well as tachycardia and tachypnea.  Patient was then subsequently made comfort care. ? ?Acute on chronic respiratory failure with hypoxia and hypercapnia (HCC) ?Secondary to CHF and sepsis from healthcare associated pneumonia.  Patient was then made comfort care and antibiotics were discontinued and she was placed on a morphine drip.  She passed away on April 09, 2023. ? ?COPD (chronic obstructive pulmonary  disease) ([place]) ?Exacerbated by pneumonia. ? ?Senile dementia with acute confusional state, without behavioral disturbance ?Secondary to hypoxia and confusion and hypercarbia.  Patient was calm during comfort care. ? ?Hypertension ?Antihypertensives initially held due to hypotension from sepsis. ? ?Overweight (BMI 25.0-29.9) ?Met criteria with BMI greater than 25 ? ? ? ? ?  ? ? ?Procedures: None ? ?Consultations: Critical care ? ?The results of significant diagnostics from this hospitalization (including imaging, microbiology, ancillary and laboratory) are listed below for reference.  ? ?Significant Diagnostic Studies: ?CT Angio Chest PE W/Cm &/Or Wo Cm ? ?Result Date: April 06, 2022 ?CLINICAL DATA:  Shortness of breath, hypoxia EXAM: CT ANGIOGRAPHY CHEST WITH CONTRAST TECHNIQUE: Multidetector CT imaging of the chest was performed using the standard protocol during bolus administration of intravenous contrast. Multiplanar CT image reconstructions and MIPs were obtained to evaluate the vascular anatomy. RADIATION DOSE REDUCTION: This exam was performed according to the departmental dose-optimization program which includes automated exposure control, adjustment of the mA and/or kV according to patient size and/or use of iterative reconstruction technique. CONTRAST:  11mL OMNIPAQUE IOHEXOL 350 MG/ML SOLN COMPARISON:  11/30/2021 FINDINGS: Cardiovascular: Contrast density in the thoracic aorta is less than adequate to evaluate the lumen. There is ectasia of ascending thoracic aorta measuring 3.7 cm. There is mild ectasia of main pulmonary artery measuring 3.3 cm. There are no intraluminal filling defects in the central pulmonary artery branches. Evaluation of small peripheral branches in the lower lung fields is limited by infiltrates. Coronary artery calcifications are seen. Mediastinum/Nodes: No new significant lymphadenopathy seen. Lungs/Pleura: Centrilobular emphysema is seen. Breathing motion limits evaluation of lung  fields. There is patchy alveolar infiltrate in the posterior segment of right upper  lobe. Subtle increased markings are seen in the posterior segment of left upper lobe. There is small linear infiltrate in the central portion of right upper lung fields. There are faint ground-glass densities in the mid and lower lung fields, more so on the right side. There are patchy infiltrates in both lower lobes. Minimal bilateral pleural effusions are seen. There is no pneumothorax. Upper Abdomen: There is subtle increased density in the lumen of gallbladder. There is 2.2 cm nodule in the left adrenal with no significant change. There is moderate sized fixed hiatal hernia. Musculoskeletal: There is decrease in height of body of T3 vertebra which has not changed. There is exaggeration of kyphosis in the upper thoracic spine. Review of the MIP images confirms the above findings. IMPRESSION: There is no evidence of pulmonary artery embolism. There is ectasia of main pulmonary artery suggesting possible pulmonary arterial hypertension. Infiltrates are seen in both lungs, more so in both lower lobes suggesting atelectasis/pneumonia. There are ground-glass densities in the mid and lower lung fields suggesting possible interstitial pneumonia or interstitial edema. COPD. Small bilateral pleural effusions. There is subtle increased density in the gallbladder lumen suggesting possible gallbladder stones. There is 2.2 cm left adrenal nodule suggesting adenoma or some other neoplastic process with no significant interval change. There is moderate sized fixed hiatal hernia. Other findings as described in the body of the report. Electronically Signed   By: Elmer Picker M.D.   On: 03/13/2022 11:07  ? ?DG Chest Port 1 View ? ?Result Date: 02/23/2022 ?CLINICAL DATA:  Shortness of breath EXAM: PORTABLE CHEST 1 VIEW COMPARISON:  AP chest 11/30/2021 and 08/13/2021 FINDINGS: Cardiac silhouette and mediastinal contours are within normal  limits. Moderate bilateral interstitial thickening is slightly worsened from prior, with minimal right-greater-than-left midlung heterogeneous airspace opacities. Bibasilar linear subsegmental atelectasis/scarring is unchanged. No pleural effusion or pneumothorax. No acute skeletal abnormality. IMPRESSION: Moderate bilateral interstitial thickening is slightly worsened from prior and again new compared to 08/13/2021, suggesting mildly worsened moderate interstitial pulmonary edema. Electronically Signed   By: Yvonne Kendall M.D.   On: 03/10/2022 09:14   ? ?Microbiology: ?Recent Results (from the past 240 hour(s))  ?Resp Panel by RT-PCR (Flu A&B, Covid) Nasopharyngeal Swab     Status: None  ? Collection Time: 03/02/2022  8:45 AM  ? Specimen: Nasopharyngeal Swab; Nasopharyngeal(NP) swabs in vial transport medium  ?Result Value Ref Range Status  ? SARS Coronavirus 2 by RT PCR NEGATIVE NEGATIVE Final  ?  Comment: (NOTE) ?SARS-CoV-2 target nucleic acids are NOT DETECTED. ? ?The SARS-CoV-2 RNA is generally detectable in upper respiratory ?specimens during the acute phase of infection. The lowest ?concentration of SARS-CoV-2 viral copies this assay can detect is ?138 copies/mL. A negative result does not preclude SARS-Cov-2 ?infection and should not be used as the sole basis for treatment or ?other patient management decisions. A negative result may occur with  ?improper specimen collection/handling, submission of specimen other ?than nasopharyngeal swab, presence of viral mutation(s) within the ?areas targeted by this assay, and inadequate number of viral ?copies(<138 copies/mL). A negative result must be combined with ?clinical observations, patient history, and epidemiological ?information. The expected result is Negative. ? ?Fact Sheet for Patients:  ?EntrepreneurPulse.com.au ? ?Fact Sheet for Healthcare Providers:  ?IncredibleEmployment.be ? ?This test is no t yet approved or cleared by  the [place] FDA and  ?has been authorized for detection and/or diagnosis of SARS-CoV-2 by ?FDA under an Emergency Use Authorization (EUA). This EUA will remain  ?in effect (  meaning this test can be used) for

## 2022-03-24 NOTE — Assessment & Plan Note (Signed)
Met criteria for sepsis on admission given persistent hypotension despite IV fluids as well as tachycardia and tachypnea.  Patient was then subsequently made comfort care. ?

## 2022-03-24 NOTE — Assessment & Plan Note (Signed)
Exacerbated by pneumonia. ?

## 2022-03-24 DEATH — deceased

## 2022-04-13 IMAGING — CR DG CHEST 2V
1 series · 3 of 3 positions shown · non-contrast
Comparison: 10/23/2020

CLINICAL DATA: Altered level of consciousness, disorientation

EXAM:
CHEST - 2 VIEW

[Series 1: dg chest 2 view · 0.14mm/px · 3 of 3 slices shown]
[im 1/3]
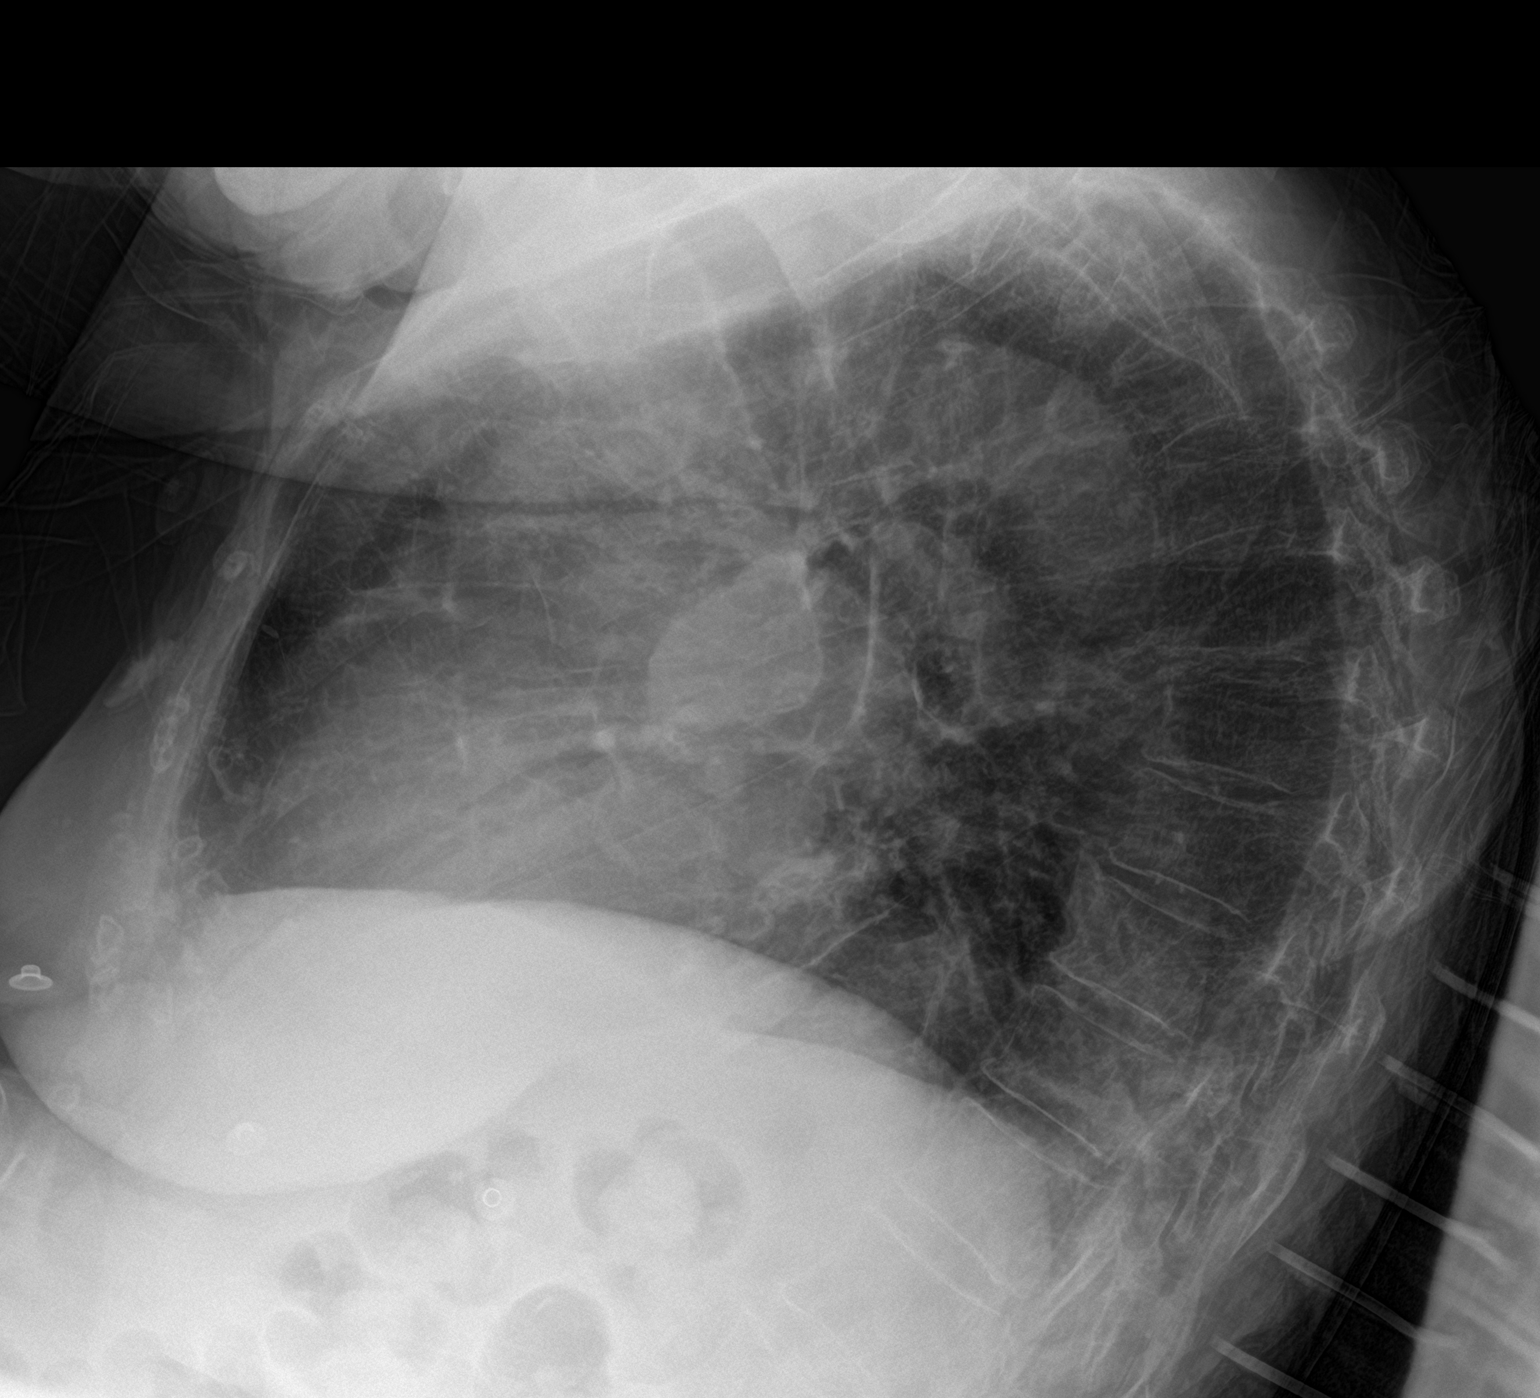
[im 2/3]
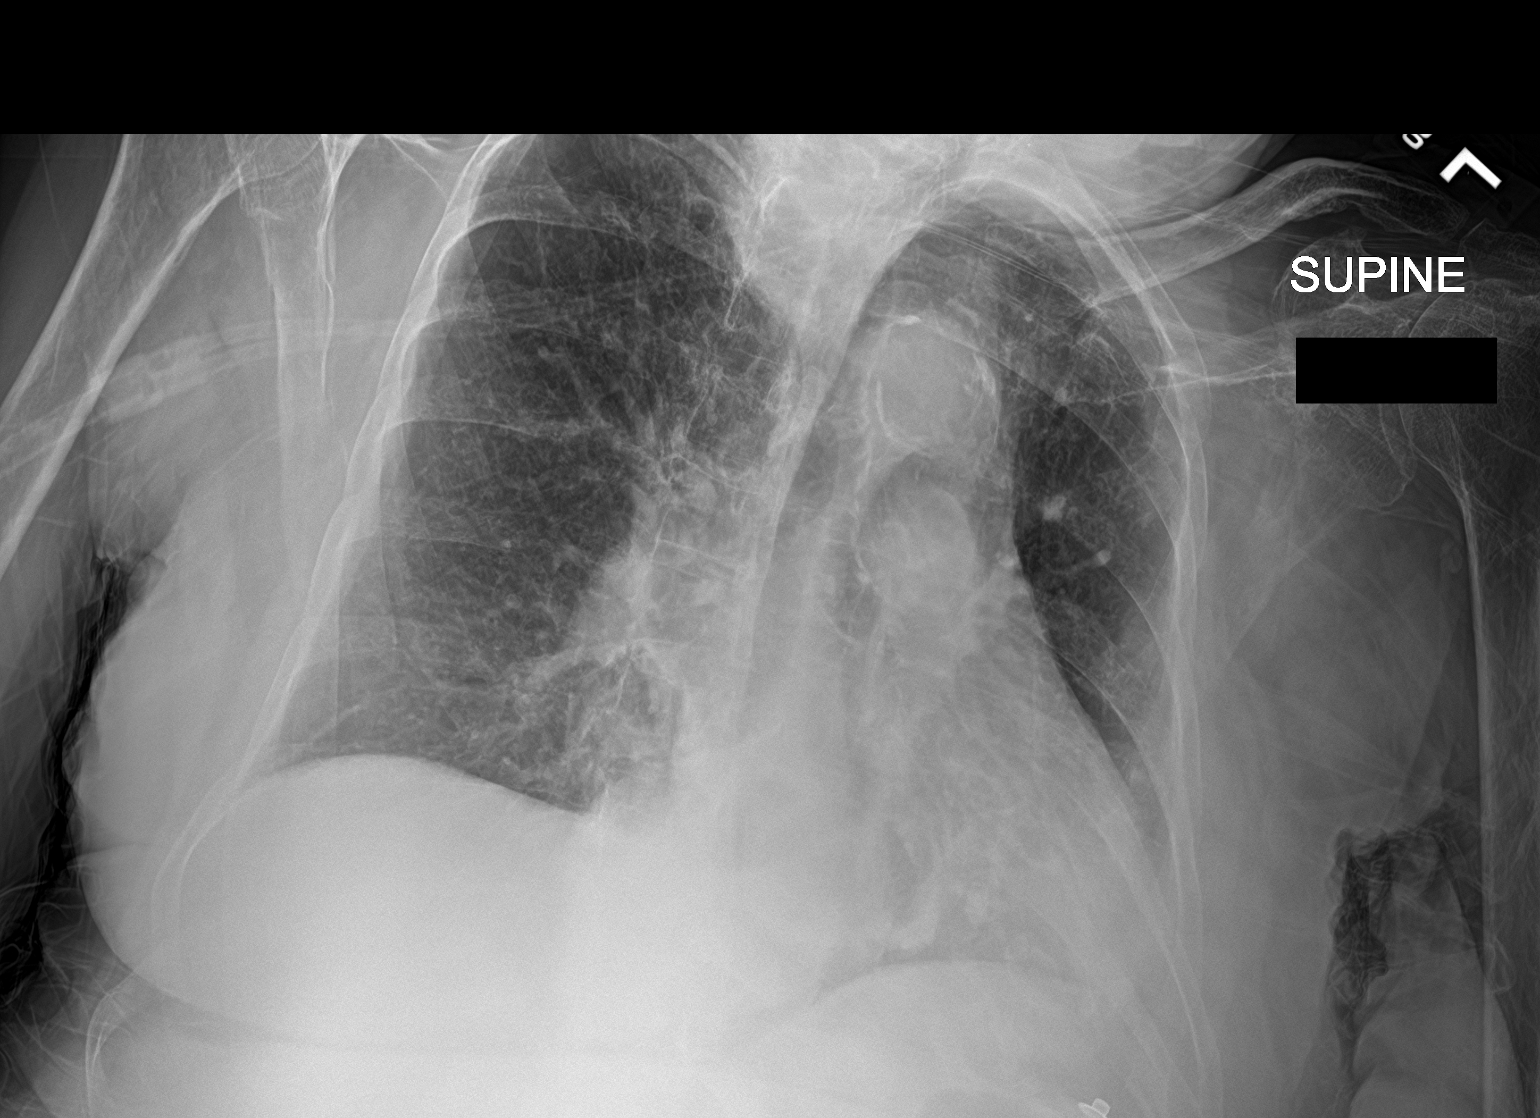
[im 3/3]
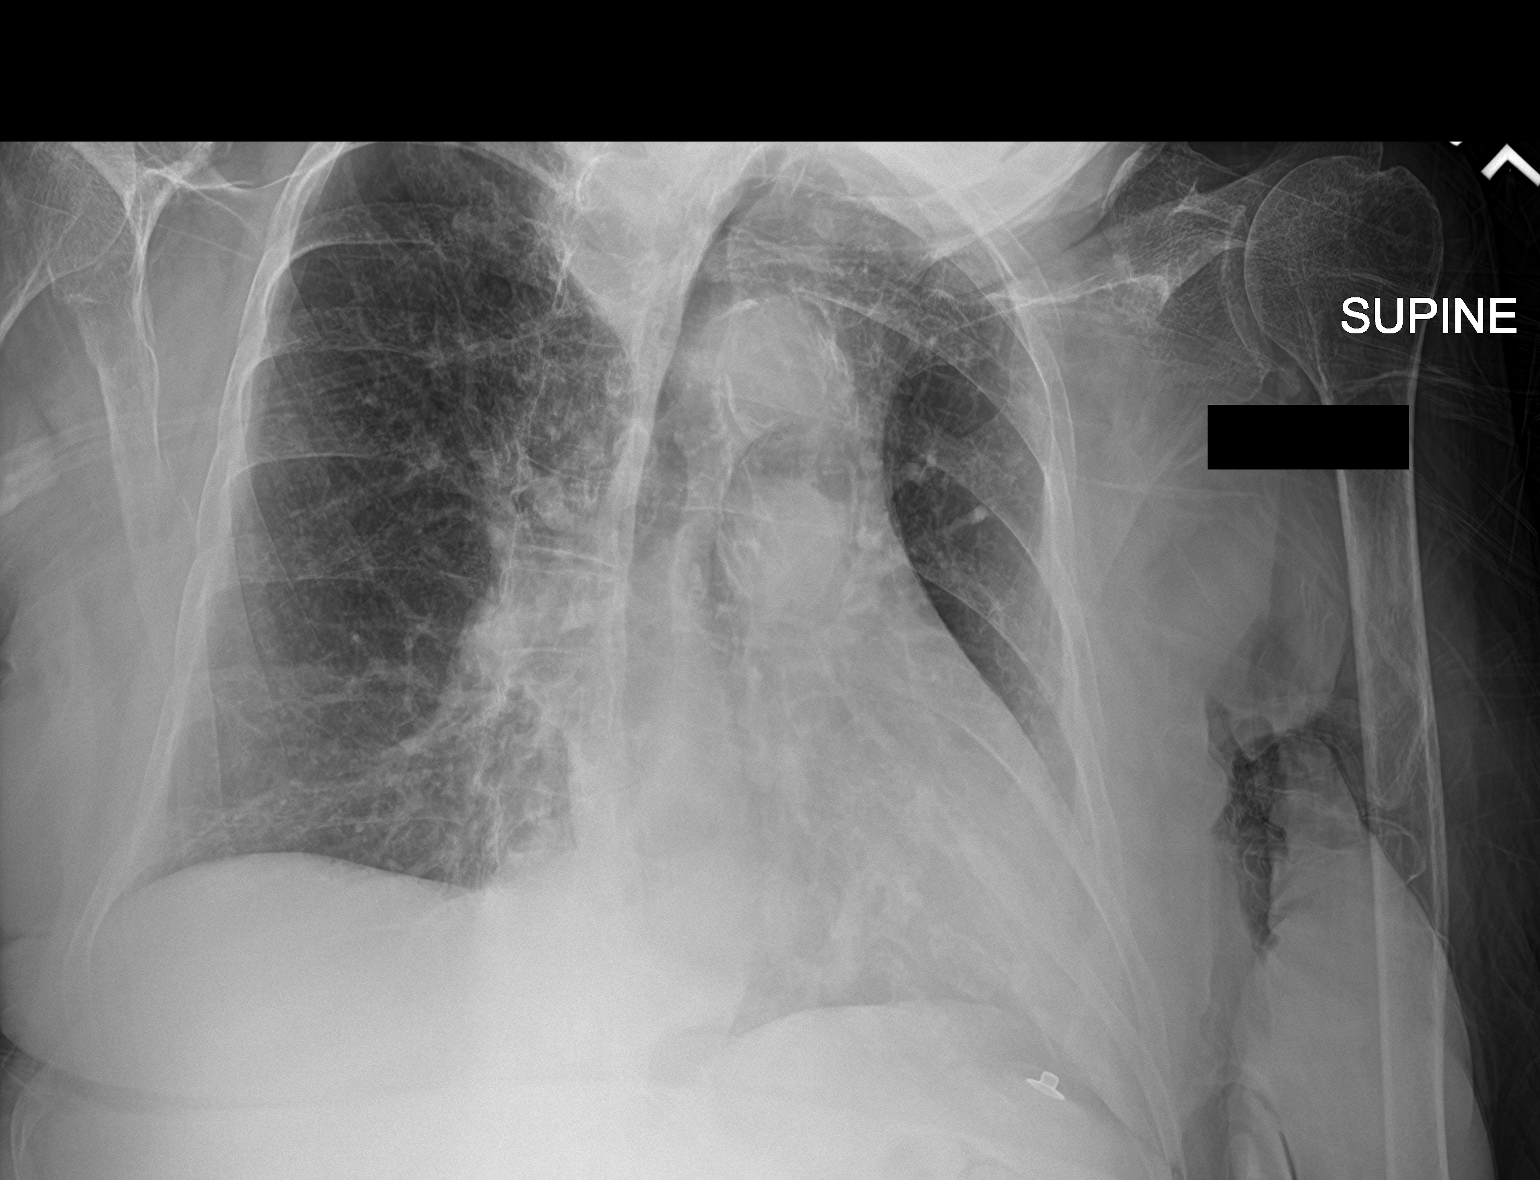

[3 of 3 positions shown; findings below may reference images not displayed]

FINDINGS: Frontal and lateral views of the chest are obtained. Cardiac
silhouette is enlarged but stable. Dense calcification of the mitral
annulus. Moderate hiatal hernia again noted. Lungs are
hyperinflated. There is patchy consolidation within the left lower
lobe, with blunting of the costophrenic angle. No pneumothorax.
IMPRESSION: 1. Left lower lobe consolidation which may reflect airspace disease
or atelectasis.
2. Small left pleural effusion.
3. Hiatal hernia.

## 2022-04-13 IMAGING — CT CT HEAD W/O CM
3 series · 15 of 47 positions shown, 18 images · non-contrast
Comparison: 02/27/2021

CLINICAL DATA: Mental status change

EXAM:
CT HEAD WITHOUT CONTRAST
TECHNIQUE: Contiguous axial images were obtained from the base of the skull
through the vertex without intravenous contrast.

[Series 3: head wo · axial · 0.41mm/px · z∈[+308,+433]mm · 9 of 30 slices shown, 12 images]
[im 3/30  brain]
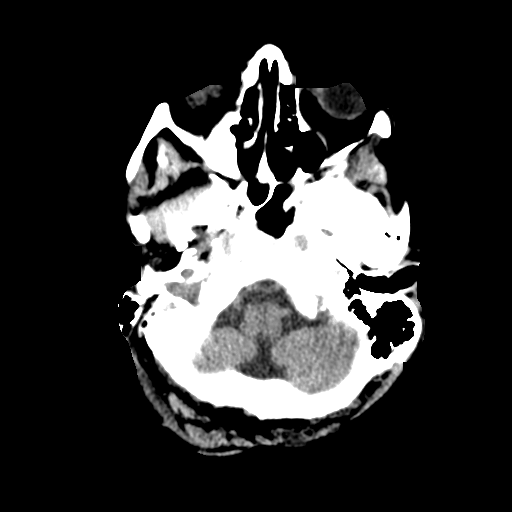
[im 3/30  bone]
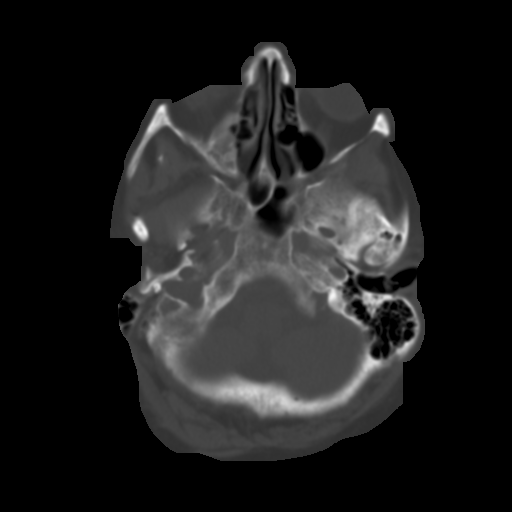
[im 6/30  brain]
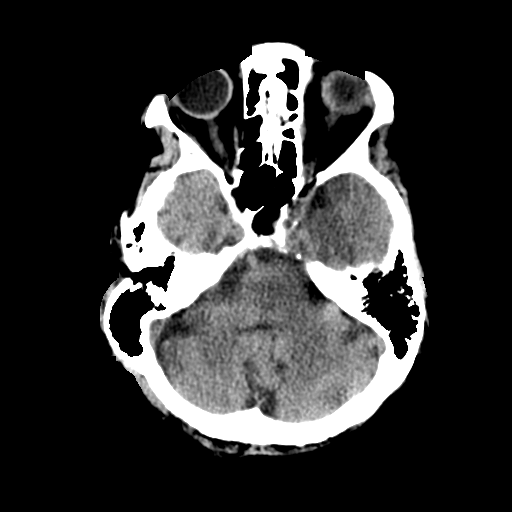
[im 9/30  brain]
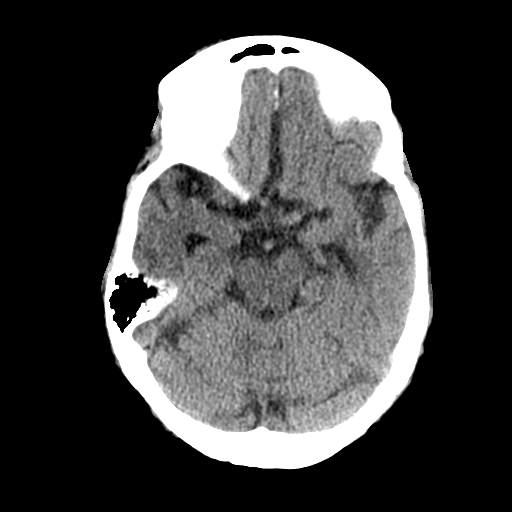
[im 12/30  brain]
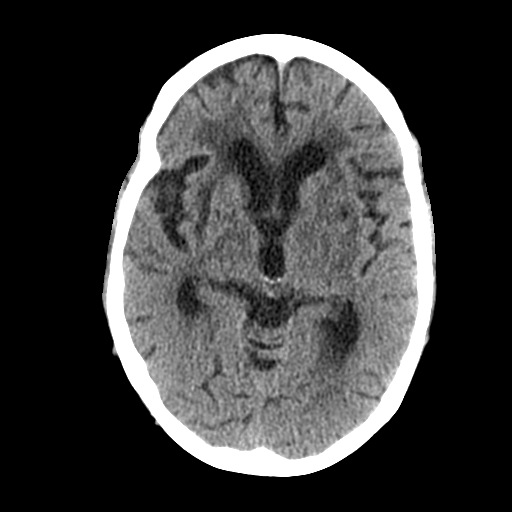
[im 16/30  brain]
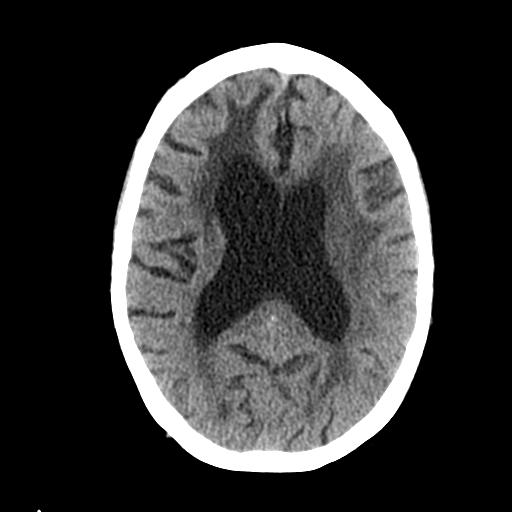
[im 16/30  bone]
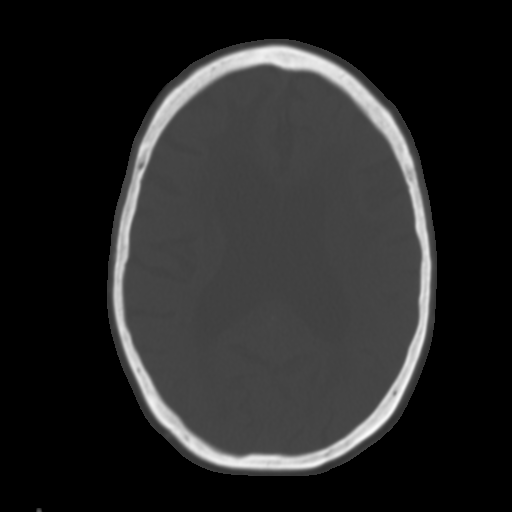
[im 19/30  brain]
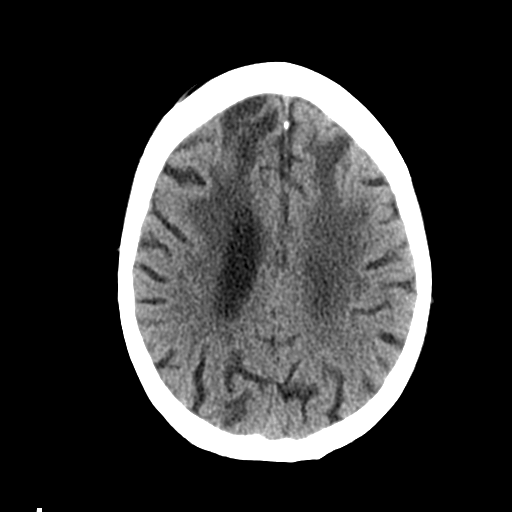
[im 22/30  brain]
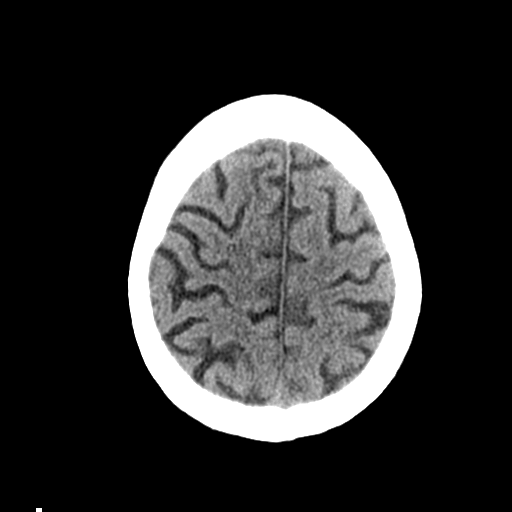
[im 25/30  brain]
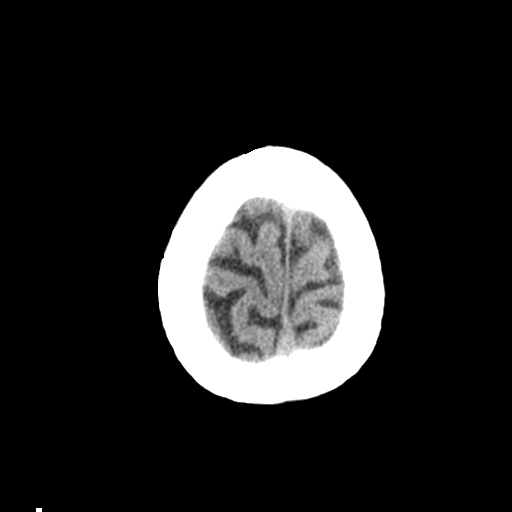
[im 28/30  brain]
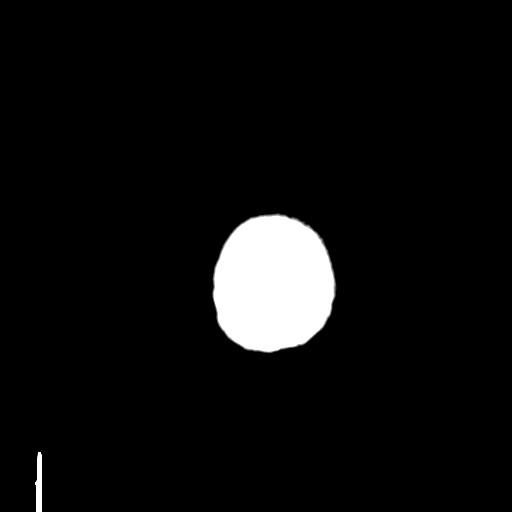
[im 28/30  bone]
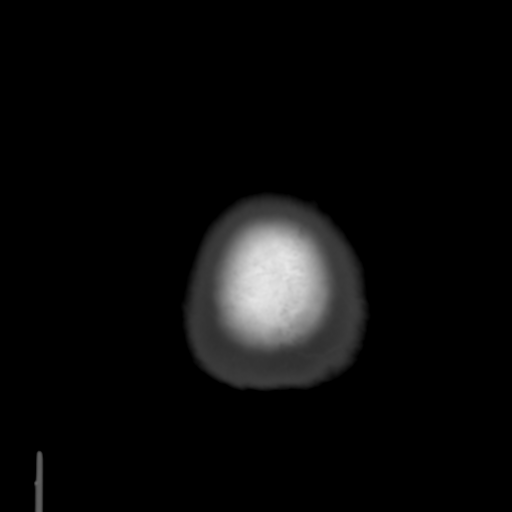

[Series 4: coronal soft tissue · coronal · 0.31mm/px · 3 of 63 slices shown]
[im 21/63  brain]
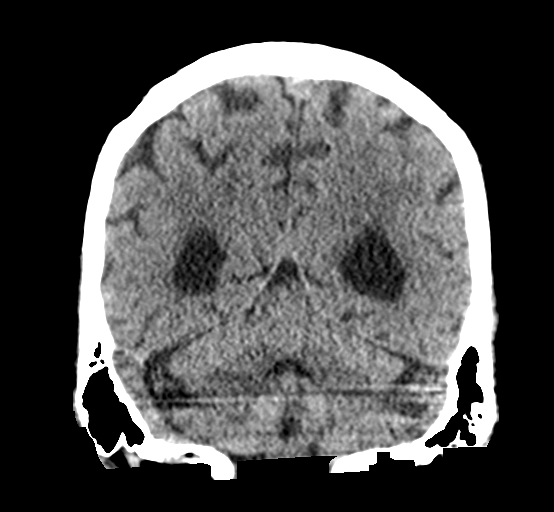
[im 28/63  brain]
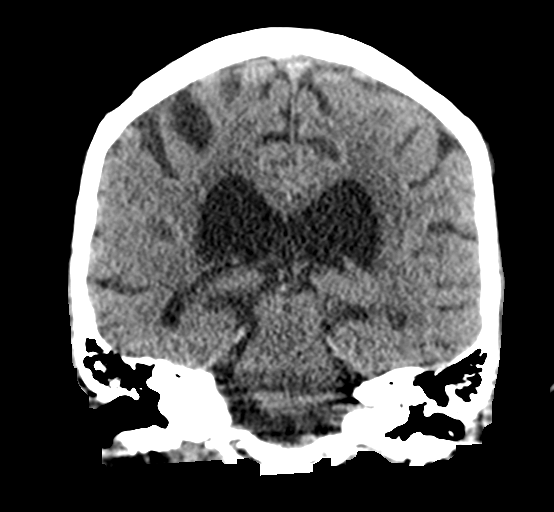
[im 35/63  brain]
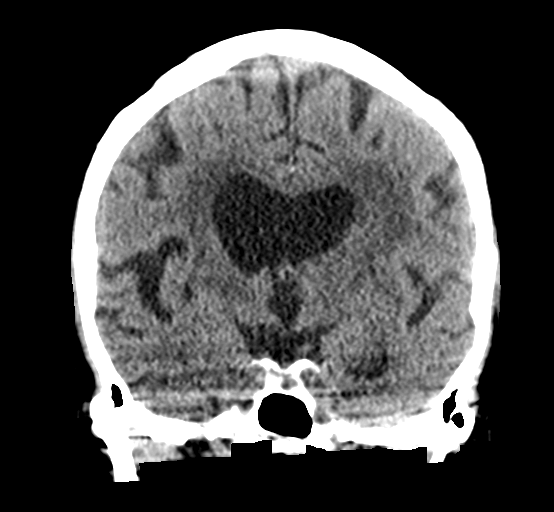

[Series 5: sagittal soft tissue · sagittal · 0.29mm/px · 3 of 51 slices shown]
[im 17/51  brain]
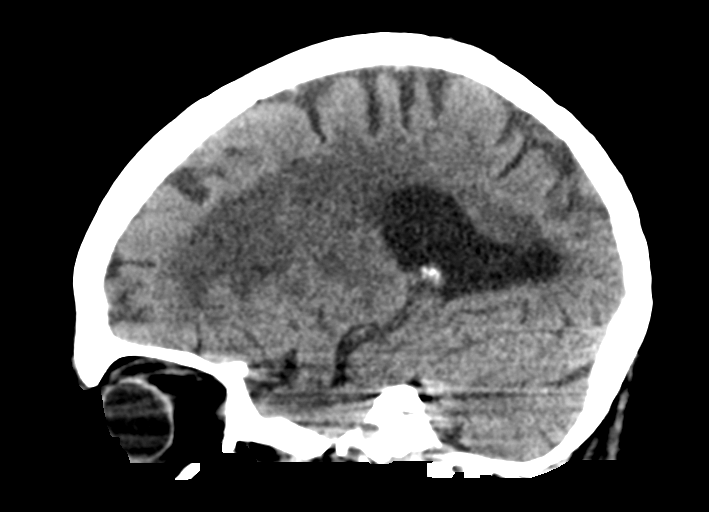
[im 26/51  brain]
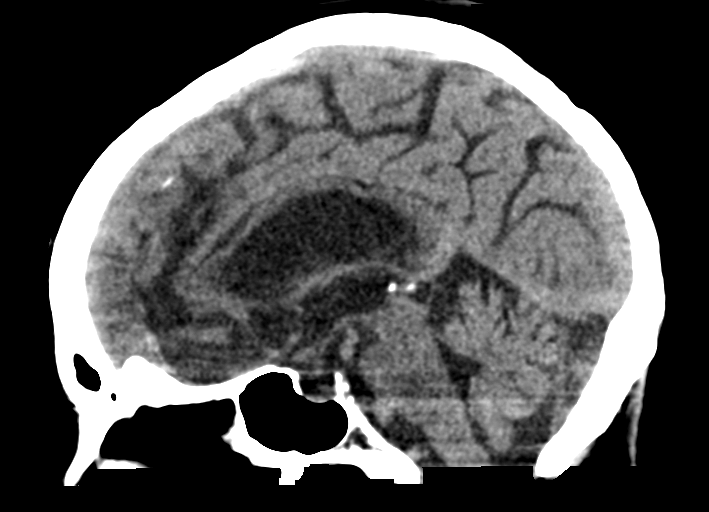
[im 34/51  brain]
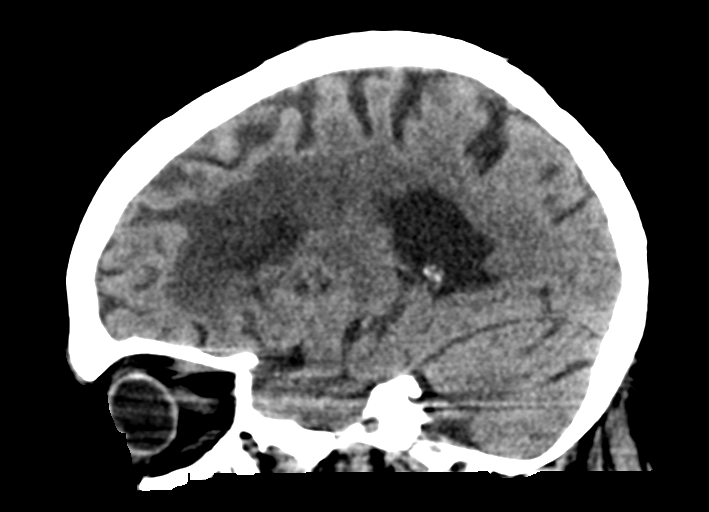

[15 of 47 positions shown; findings below may reference images not displayed]

FINDINGS: Brain: No evidence of acute infarction, hemorrhage, hydrocephalus,
extra-axial collection or mass lesion/mass effect. Periventricular
and deep white matter hypodensity with encephalomalacia of the right
greater than left frontal poles, appearance and configuration
unchanged.

Vascular: No hyperdense vessel or unexpected calcification.

Skull: Normal. Negative for fracture or focal lesion.

Sinuses/Orbits: No acute finding.

Other: None.
IMPRESSION: No acute intracranial pathology. Advanced small-vessel white matter
disease and encephalomalacia of the right greater than left frontal
poles, appearance and configuration unchanged from prior.
